# Patient Record
Sex: Male | Born: 1940 | Race: White | Hispanic: No | Marital: Married | Smoking: Former smoker
Health system: Southern US, Community
[De-identification: ages and names within clinical notes are randomized; demographics above are authoritative.]

## PROBLEM LIST (undated history)

## (undated) DIAGNOSIS — Z86010 Personal history of colonic polyps: Secondary | ICD-10-CM

## (undated) DIAGNOSIS — R5381 Other malaise: Secondary | ICD-10-CM

## (undated) DIAGNOSIS — J309 Allergic rhinitis, unspecified: Secondary | ICD-10-CM

## (undated) DIAGNOSIS — I2510 Atherosclerotic heart disease of native coronary artery without angina pectoris: Secondary | ICD-10-CM

## (undated) DIAGNOSIS — H6990 Unspecified Eustachian tube disorder, unspecified ear: Secondary | ICD-10-CM

## (undated) DIAGNOSIS — M25569 Pain in unspecified knee: Secondary | ICD-10-CM

## (undated) DIAGNOSIS — F17200 Nicotine dependence, unspecified, uncomplicated: Secondary | ICD-10-CM

## (undated) DIAGNOSIS — I219 Acute myocardial infarction, unspecified: Secondary | ICD-10-CM

## (undated) DIAGNOSIS — Z5189 Encounter for other specified aftercare: Secondary | ICD-10-CM

## (undated) DIAGNOSIS — R5383 Other fatigue: Secondary | ICD-10-CM

## (undated) DIAGNOSIS — E785 Hyperlipidemia, unspecified: Secondary | ICD-10-CM

## (undated) DIAGNOSIS — N529 Male erectile dysfunction, unspecified: Secondary | ICD-10-CM

## (undated) DIAGNOSIS — M479 Spondylosis, unspecified: Secondary | ICD-10-CM

## (undated) DIAGNOSIS — M1990 Unspecified osteoarthritis, unspecified site: Secondary | ICD-10-CM

## (undated) DIAGNOSIS — N400 Enlarged prostate without lower urinary tract symptoms: Secondary | ICD-10-CM

## (undated) DIAGNOSIS — T7840XA Allergy, unspecified, initial encounter: Secondary | ICD-10-CM

## (undated) DIAGNOSIS — I252 Old myocardial infarction: Secondary | ICD-10-CM

## (undated) DIAGNOSIS — F329 Major depressive disorder, single episode, unspecified: Secondary | ICD-10-CM

## (undated) DIAGNOSIS — Z87898 Personal history of other specified conditions: Secondary | ICD-10-CM

## (undated) DIAGNOSIS — K219 Gastro-esophageal reflux disease without esophagitis: Secondary | ICD-10-CM

## (undated) DIAGNOSIS — G4700 Insomnia, unspecified: Secondary | ICD-10-CM

## (undated) HISTORY — DX: Allergic rhinitis, unspecified: J30.9

## (undated) HISTORY — DX: Personal history of other specified conditions: Z87.898

## (undated) HISTORY — DX: Benign prostatic hyperplasia without lower urinary tract symptoms: N40.0

## (undated) HISTORY — DX: Encounter for other specified aftercare: Z51.89

## (undated) HISTORY — DX: Spondylosis, unspecified: M47.9

## (undated) HISTORY — DX: Pain in unspecified knee: M25.569

## (undated) HISTORY — DX: Male erectile dysfunction, unspecified: N52.9

## (undated) HISTORY — DX: Personal history of colonic polyps: Z86.010

## (undated) HISTORY — DX: Other fatigue: R53.83

## (undated) HISTORY — DX: Other malaise: R53.81

## (undated) HISTORY — DX: Unspecified eustachian tube disorder, unspecified ear: H69.90

## (undated) HISTORY — DX: Allergy, unspecified, initial encounter: T78.40XA

## (undated) HISTORY — DX: Unspecified osteoarthritis, unspecified site: M19.90

## (undated) HISTORY — PX: COLONOSCOPY: SHX174

## (undated) HISTORY — DX: Atherosclerotic heart disease of native coronary artery without angina pectoris: I25.10

## (undated) HISTORY — DX: Acute myocardial infarction, unspecified: I21.9

## (undated) HISTORY — DX: Nicotine dependence, unspecified, uncomplicated: F17.200

## (undated) HISTORY — DX: Hyperlipidemia, unspecified: E78.5

## (undated) HISTORY — DX: Gastro-esophageal reflux disease without esophagitis: K21.9

## (undated) HISTORY — PX: TONSILLECTOMY AND ADENOIDECTOMY: SUR1326

## (undated) HISTORY — DX: Old myocardial infarction: I25.2

## (undated) HISTORY — DX: Insomnia, unspecified: G47.00

## (undated) HISTORY — PX: CARDIAC CATHETERIZATION: SHX172

## (undated) HISTORY — DX: Major depressive disorder, single episode, unspecified: F32.9

## (undated) HISTORY — PX: POLYPECTOMY: SHX149

---
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
## 1976-07-23 HISTORY — PX: VASECTOMY: SHX75

## 1989-07-23 HISTORY — PX: CORONARY ARTERY BYPASS GRAFT: SHX141

## 2005-04-02 ENCOUNTER — Ambulatory Visit: Payer: Self-pay | Admitting: Internal Medicine

## 2006-01-24 ENCOUNTER — Ambulatory Visit: Payer: Self-pay | Admitting: Internal Medicine

## 2006-01-24 ENCOUNTER — Ambulatory Visit (HOSPITAL_COMMUNITY): Admission: RE | Admit: 2006-01-24 | Discharge: 2006-01-24 | Payer: Self-pay | Admitting: Internal Medicine

## 2006-06-17 ENCOUNTER — Ambulatory Visit: Payer: Self-pay | Admitting: Internal Medicine

## 2006-06-17 LAB — CONVERTED CEMR LAB
ALT: 51 units/L — ABNORMAL HIGH (ref 0–40)
Albumin: 4.3 g/dL (ref 3.5–5.2)
Alkaline Phosphatase: 46 units/L (ref 39–117)
Basophils Absolute: 0 10*3/uL (ref 0.0–0.1)
CO2: 23 meq/L (ref 19–32)
Chol/HDL Ratio, serum: 4.5
GFR calc non Af Amer: 80 mL/min
Glucose, Bld: 115 mg/dL — ABNORMAL HIGH (ref 70–99)
MCV: 93.1 fL (ref 78.0–100.0)
Monocytes Absolute: 0.8 10*3/uL — ABNORMAL HIGH (ref 0.2–0.7)
Neutro Abs: 9.1 10*3/uL — ABNORMAL HIGH (ref 1.4–7.7)
Neutrophils Relative %: 75.7 % (ref 43.0–77.0)
PSA: 1.45 ng/mL
Platelets: 271 10*3/uL (ref 150–400)
RBC: 5.19 M/uL (ref 4.22–5.81)
RDW: 12.7 % (ref 11.5–14.6)
Specific Gravity, Urine: 1.02 (ref 1.000–1.03)
TSH: 1.7 microintl units/mL (ref 0.35–5.50)
Total Bilirubin: 1.2 mg/dL (ref 0.3–1.2)
Total Protein, Urine: 30 mg/dL — AB
Urine Glucose: NEGATIVE mg/dL
Urobilinogen, UA: 0.2 (ref 0.0–1.0)
VLDL: 14 mg/dL (ref 0–40)

## 2007-02-14 ENCOUNTER — Ambulatory Visit: Payer: Self-pay | Admitting: Internal Medicine

## 2007-02-15 ENCOUNTER — Encounter: Payer: Self-pay | Admitting: Internal Medicine

## 2007-02-15 DIAGNOSIS — Z87898 Personal history of other specified conditions: Secondary | ICD-10-CM | POA: Insufficient documentation

## 2007-02-15 DIAGNOSIS — J309 Allergic rhinitis, unspecified: Secondary | ICD-10-CM

## 2007-02-15 DIAGNOSIS — E785 Hyperlipidemia, unspecified: Secondary | ICD-10-CM

## 2007-02-15 DIAGNOSIS — I2510 Atherosclerotic heart disease of native coronary artery without angina pectoris: Secondary | ICD-10-CM | POA: Insufficient documentation

## 2007-02-15 DIAGNOSIS — I252 Old myocardial infarction: Secondary | ICD-10-CM

## 2007-02-15 DIAGNOSIS — N400 Enlarged prostate without lower urinary tract symptoms: Secondary | ICD-10-CM

## 2007-02-15 DIAGNOSIS — M479 Spondylosis, unspecified: Secondary | ICD-10-CM | POA: Insufficient documentation

## 2007-02-15 HISTORY — DX: Atherosclerotic heart disease of native coronary artery without angina pectoris: I25.10

## 2007-02-15 HISTORY — DX: Hyperlipidemia, unspecified: E78.5

## 2007-02-15 HISTORY — DX: Old myocardial infarction: I25.2

## 2007-02-15 HISTORY — DX: Allergic rhinitis, unspecified: J30.9

## 2007-02-15 HISTORY — DX: Personal history of other specified conditions: Z87.898

## 2007-02-15 HISTORY — DX: Benign prostatic hyperplasia without lower urinary tract symptoms: N40.0

## 2007-02-15 HISTORY — DX: Spondylosis, unspecified: M47.9

## 2007-03-10 ENCOUNTER — Ambulatory Visit: Payer: Self-pay | Admitting: Internal Medicine

## 2007-08-29 ENCOUNTER — Ambulatory Visit: Payer: Self-pay | Admitting: Internal Medicine

## 2007-08-29 ENCOUNTER — Encounter (INDEPENDENT_AMBULATORY_CARE_PROVIDER_SITE_OTHER): Payer: Self-pay | Admitting: *Deleted

## 2007-08-29 DIAGNOSIS — F329 Major depressive disorder, single episode, unspecified: Secondary | ICD-10-CM

## 2007-08-29 DIAGNOSIS — F3289 Other specified depressive episodes: Secondary | ICD-10-CM

## 2007-08-29 DIAGNOSIS — R5381 Other malaise: Secondary | ICD-10-CM

## 2007-08-29 DIAGNOSIS — Z860100 Personal history of colon polyps, unspecified: Secondary | ICD-10-CM | POA: Insufficient documentation

## 2007-08-29 DIAGNOSIS — F32A Depression, unspecified: Secondary | ICD-10-CM | POA: Insufficient documentation

## 2007-08-29 DIAGNOSIS — M25569 Pain in unspecified knee: Secondary | ICD-10-CM

## 2007-08-29 DIAGNOSIS — R5383 Other fatigue: Secondary | ICD-10-CM

## 2007-08-29 HISTORY — DX: Other specified depressive episodes: F32.89

## 2007-08-29 HISTORY — DX: Other malaise: R53.81

## 2007-08-29 HISTORY — DX: Personal history of colon polyps, unspecified: Z86.0100

## 2007-08-29 HISTORY — DX: Personal history of colonic polyps: Z86.010

## 2007-08-29 HISTORY — DX: Pain in unspecified knee: M25.569

## 2007-08-29 HISTORY — DX: Major depressive disorder, single episode, unspecified: F32.9

## 2007-09-01 LAB — CONVERTED CEMR LAB
Basophils Absolute: 0.6 10*3/uL — ABNORMAL HIGH (ref 0.0–0.1)
CO2: 29 meq/L (ref 19–32)
Cholesterol: 225 mg/dL (ref 0–200)
Creatinine, Ser: 0.9 mg/dL (ref 0.4–1.5)
Direct LDL: 167.6 mg/dL
Eosinophils Relative: 2.2 % (ref 0.0–5.0)
GFR calc Af Amer: 109 mL/min
GFR calc non Af Amer: 90 mL/min
HCT: 43.3 % (ref 39.0–52.0)
Monocytes Absolute: 0.9 10*3/uL — ABNORMAL HIGH (ref 0.2–0.7)
Monocytes Relative: 7.6 % (ref 3.0–11.0)
Neutro Abs: 7.5 10*3/uL (ref 1.4–7.7)
Potassium: 4.6 meq/L (ref 3.5–5.1)
RBC: 4.67 M/uL (ref 4.22–5.81)
Total Bilirubin: 0.7 mg/dL (ref 0.3–1.2)
Triglycerides: 130 mg/dL (ref 0–149)
VLDL: 26 mg/dL (ref 0–40)

## 2007-10-01 ENCOUNTER — Ambulatory Visit: Payer: Self-pay | Admitting: Gastroenterology

## 2007-12-03 ENCOUNTER — Encounter: Payer: Self-pay | Admitting: Gastroenterology

## 2007-12-03 ENCOUNTER — Ambulatory Visit: Payer: Self-pay | Admitting: Gastroenterology

## 2007-12-05 ENCOUNTER — Encounter: Payer: Self-pay | Admitting: Gastroenterology

## 2009-03-08 ENCOUNTER — Ambulatory Visit: Payer: Self-pay | Admitting: Internal Medicine

## 2009-03-11 ENCOUNTER — Telehealth: Payer: Self-pay | Admitting: Internal Medicine

## 2010-03-09 ENCOUNTER — Encounter: Payer: Self-pay | Admitting: Internal Medicine

## 2010-03-09 ENCOUNTER — Ambulatory Visit: Payer: Self-pay | Admitting: Internal Medicine

## 2010-03-09 DIAGNOSIS — F17200 Nicotine dependence, unspecified, uncomplicated: Secondary | ICD-10-CM

## 2010-03-09 DIAGNOSIS — H6990 Unspecified Eustachian tube disorder, unspecified ear: Secondary | ICD-10-CM | POA: Insufficient documentation

## 2010-03-09 DIAGNOSIS — G4700 Insomnia, unspecified: Secondary | ICD-10-CM

## 2010-03-09 HISTORY — DX: Nicotine dependence, unspecified, uncomplicated: F17.200

## 2010-03-09 HISTORY — DX: Insomnia, unspecified: G47.00

## 2010-03-09 HISTORY — DX: Unspecified eustachian tube disorder, unspecified ear: H69.90

## 2010-03-09 LAB — CONVERTED CEMR LAB
AST: 27 units/L (ref 0–37)
Albumin: 4.3 g/dL (ref 3.5–5.2)
BUN: 18 mg/dL (ref 6–23)
Basophils Absolute: 0.1 10*3/uL (ref 0.0–0.1)
Basophils Relative: 0.8 % (ref 0.0–3.0)
Bilirubin Urine: NEGATIVE
Creatinine, Ser: 0.9 mg/dL (ref 0.4–1.5)
Folate: >20 ng/mL
GFR calc non Af Amer: 86.93 mL/min (ref >60)
Glucose, Bld: 91 mg/dL (ref 70–99)
HCT: 43.7 % (ref 39.0–52.0)
HDL: 34.7 mg/dL — ABNORMAL LOW (ref >39.00)
Hemoglobin: 15 g/dL (ref 13.0–17.0)
Iron: 111 ug/dL (ref 42–165)
Ketones, ur: NEGATIVE mg/dL
Lymphocytes Relative: 25.4 % (ref 12.0–46.0)
Lymphs Abs: 2.4 10*3/uL (ref 0.7–4.0)
MCHC: 34.4 g/dL (ref 30.0–36.0)
MCV: 94.3 fL (ref 78.0–100.0)
Monocytes Absolute: 0.7 10*3/uL (ref 0.1–1.0)
Nitrite: NEGATIVE
Potassium: 5 meq/L (ref 3.5–5.1)
RBC: 4.63 M/uL (ref 4.22–5.81)
Sed Rate: 23 mm/hr — ABNORMAL HIGH (ref 0–22)
Sodium: 138 meq/L (ref 135–145)
Total CHOL/HDL Ratio: 4
Urine Glucose: NEGATIVE mg/dL
pH: 5.5 (ref 5.0–8.0)

## 2010-03-10 ENCOUNTER — Encounter: Payer: Self-pay | Admitting: Internal Medicine

## 2010-03-13 ENCOUNTER — Encounter: Payer: Self-pay | Admitting: Internal Medicine

## 2010-03-13 ENCOUNTER — Ambulatory Visit: Payer: Self-pay

## 2010-04-13 ENCOUNTER — Telehealth: Payer: Self-pay | Admitting: Internal Medicine

## 2010-08-20 LAB — CONVERTED CEMR LAB
ALT: 30 units/L (ref 0–53)
BUN: 18 mg/dL (ref 6–23)
Basophils Absolute: 0.1 10*3/uL (ref 0.0–0.1)
Bilirubin Urine: NEGATIVE
Bilirubin, Direct: 0.1 mg/dL (ref 0.0–0.3)
CO2: 28 meq/L (ref 19–32)
Calcium: 8.8 mg/dL (ref 8.4–10.5)
Cholesterol: 230 mg/dL — ABNORMAL HIGH (ref 0–200)
Direct LDL: 168.6 mg/dL
Eosinophils Absolute: 0.4 10*3/uL (ref 0.0–0.7)
GFR calc non Af Amer: 79.19 mL/min (ref >60)
Glucose, Bld: 117 mg/dL — ABNORMAL HIGH (ref 70–99)
HDL: 27.9 mg/dL — ABNORMAL LOW (ref >39.00)
Hemoglobin: 14.5 g/dL (ref 13.0–17.0)
Hgb A1c MFr Bld: 6 % (ref 4.6–6.5)
Ketones, ur: NEGATIVE mg/dL
MCV: 94.9 fL (ref 78.0–100.0)
Neutro Abs: 4.3 10*3/uL (ref 1.4–7.7)
PSA: 1.51 ng/mL (ref 0.10–4.00)
Platelets: 245 10*3/uL (ref 150.0–400.0)
RDW: 12.4 % (ref 11.5–14.6)
Sodium: 141 meq/L (ref 135–145)
Specific Gravity, Urine: 1.015 (ref 1.000–1.030)
Total Protein, Urine: NEGATIVE mg/dL
Triglycerides: 286 mg/dL — ABNORMAL HIGH (ref 0.0–149.0)
Urine Glucose: NEGATIVE mg/dL
VLDL: 57.2 mg/dL — ABNORMAL HIGH (ref 0.0–40.0)
pH: 6 (ref 5.0–8.0)

## 2010-08-22 NOTE — Miscellaneous (Signed)
Summary: Orders Update  Clinical Lists Changes  Orders: Added new Test order of Arterial Duplex Lower Extremity (Arterial Duplex Low) - Signed 

## 2010-08-22 NOTE — Progress Notes (Signed)
    Immunization History:  Influenza Immunization History:    Influenza:  historical (04/12/2010)  [facility], [address] [place], [place] [address] Vaccine, Fluvirin Dose, 0.13ml Injection site, Left Deltoid IM Mfg, [employer]is Date Administered, 04/12/2010 Lot # [id number]

## 2010-08-22 NOTE — Assessment & Plan Note (Signed)
Summary: YEARLY FU/ MEDICARE/ TO COME FASTING/NWS   Vital Signs:  Patient profile:   70 year old male Height:      66.5 inches Weight:      175.75 pounds BMI:     28.04 O2 Sat:      97 % on Room air Temp:     98.6 degrees F oral Pulse rate:   47 / minute BP sitting:   120 / 80  (left arm) Cuff size:   regular  Vitals Entered By: Zella Ball Ewing CMA Duncan Dull) (March 09, 2010 10:58 AM)  O2 Flow:  Room air  CC: yearly followup/RE/wellness    CC:  yearly followup/RE/wellness .  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  Also with signficant issue with sleep at night - wife wears CPAP and he is sleeping more lighlty as he gets older. Pt denies CP, worsening sob, doe, wheezing, orthopnea, pnd, worsening LE edema, palps, dizziness or syncope No fever, wt loss, night sweats, loss of appetite or other constitutional symptoms  Also with several weeks nasal allergy symptoms without facial pain, pressure, fever or d/c.  Has some bilat ear popping, without pain or decreased hearing  Here for wellness Diet: Heart Healthy or DM if diabetic Physical Activities: Sedentary Depression/mood screen: Negative Hearing: Intact bilateral Visual Acuity: Grossly normal, does not get exams, werars reading glasses ADL's: Capable  Fall Risk: None Home Safety: Good Cognitive Impairment:  Gen appearance, affect, speech, memory, attention & motor skills grossly intact End-of-Life Planning: Advance directive - Full code/I agree ,  but has living will , does not want prolonged mech  ventilation  Preventive Screening-Counseling & Management  Alcohol-Tobacco     Smoking Status: quit      Drug Use:  no.    Problems Prior to Update: 1)  Special Screening Malig Neoplasms Other Sites  (ICD-V76.49) 2)  Fatigue  (ICD-780.79) 3)  Unspecified Eustachian Tube Disorder  (ICD-381.9) 4)  Fatigue  (ICD-780.79) 5)  Insomnia-sleep Disorder-unspec  (ICD-780.52) 6)  Tobacco Abuse  (ICD-305.1) 7)  Preventive Health Care  (ICD-V70.0) 8)  Fatigue  (ICD-780.79) 9)  Special Screening Malignant Neoplasm of Prostate  (ICD-V76.44) 10)  Fatigue  (ICD-780.79) 11)  Knee Pain, Left  (ICD-719.46) 12)  Depression  (ICD-311) 13)  Colonic Polyps, Hx of  (ICD-V12.72) 14)  Shingles, Hx of  (ICD-V13.8) 15)  Degenerative Joint Disease, 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 1 By Mouth Qd 2)  Nasacort Aq 55 Mcg/act Aers (Triamcinolone Acetonide(Nasal)) .... Inhale 2 Spray As Directed Once A Day 3)  Ecotrin Low Strength 81 Mg  Tbec (Aspirin) .Marland Kitchen.. 1 By Mouth Qd 4)  Atenolol 25 Mg  Tabs (Atenolol) .Marland Kitchen.. 1 By Mouth Qd 5)  Levitra 20 Mg  Tabs (Vardenafil Hcl) .Marland KitchenMarland KitchenMarland Kitchen  1 By Mouth Once Daily Prn 6)  Cetirizine Hcl 10 Mg Tabs (Cetirizine Hcl) .Marland Kitchen.. 1po Once Daily  Current Medications (verified): 1)  Crestor 20 Mg  Tabs (Rosuvastatin Calcium) .Marland Kitchen.. 1 By Mouth Once Daily 2)  Nasacort Aq 55 Mcg/act Aers (Triamcinolone Acetonide(Nasal)) .... Inhale 2 Spray As Directed Once A Day 3)  Ecotrin Low Strength 81 Mg  Tbec (Aspirin) .Marland Kitchen.. 1 By Mouth Qd 4)  Atenolol 25 Mg  Tabs (Atenolol) .Marland Kitchen.. 1 By Mouth Once Daily 5)  Levitra 20 Mg  Tabs (Vardenafil Hcl) .Marland Kitchen.. 1 By Mouth Once Daily Prn 6)  Fexofenadine Hcl 180 Mg Tabs (Fexofenadine Hcl) .Marland Kitchen.. 1po  Once Daily As Needed 7)  Zolpidem Tartrate 5 Mg Tabs (Zolpidem Tartrate) .Marland Kitchen.. 1po At Bedtime As Needed  Allergies (verified): 1)  ! Pcn 2)  ! * Mycins 3)  ! * Cialis 4)  Halcion  Past History:  Past Surgical History: Last updated: 02/15/2007 Appendectomy- 1951 Coronary artery bypass graft- 1991  Family History: Last updated: 2007-09-11 father died with heart disease at 55 yo mother died with RA  Social History: Last updated: 03/09/2010 Alcohol use-yes Married 4 children retired Former Smoker - quit 2010 Drug use-no  Risk Factors: Smoking Status: quit (03/09/2010) Packs/Day: 1 PPD (02/15/2007)  Past Medical History: Allergic rhinitis Coronary artery disease Hyperlipidemia Myocardial infarction, hx of Benign prostatic hypertrophy H/O C-Spine DJD H/O Shingles Colonic polyps, hx of - adenomatous Depression E.D.  MD roster: none  Social History: Reviewed history from 09/11/07 and no changes required. Alcohol use-yes Married 4 children retired Former Smoker - quit 2010 Drug use-no Smoking Status:  quit Drug Use:  no  Review of Systems  The patient denies anorexia, fever, vision loss, decreased hearing, hoarseness, chest pain, syncope, dyspnea on exertion, peripheral edema, prolonged cough, headaches, hemoptysis, abdominal pain, melena, hematochezia, severe indigestion/heartburn, hematuria, muscle weakness, suspicious skin lesions, transient blindness, difficulty walking, depression, unusual weight change, abnormal bleeding, enlarged lymph nodes, and angioedema.         all otherwise negative per pt -  except for mild ongoing fatigue without OSA symtpoms or worsening depressive symptoms, and ongoing insomnia with problem getting to sleep  Physical Exam  General:  alert and well-developed.   Head:  normocephalic and atraumatic.   Eyes:  vision grossly intact, pupils equal, and pupils round.   Ears:  R ear normal and L ear normal.   Nose:  no external  deformity and no nasal discharge.   Mouth:  no gingival abnormalities and pharynx pink and moist.   Neck:  supple and no masses.   Lungs:  normal respiratory effort and normal breath sounds.   Heart:  normal rate and regular rhythm.   Abdomen:  soft, non-tender, and normal bowel sounds.   Msk:  no joint tenderness and no joint swelling.   Extremities:  no edema, no erythema  Neurologic:  cranial nerves II-XII intact and strength normal in all extremities.   Skin:  color normal and no rashes.   Psych:  not anxious appearing and not depressed appearing.     Impression & Recommendations:  Problem # 1:  Preventive Health Care (ICD-V70.0)  Overall doing well, age appropriate education and counseling updated and referral for appropriate preventive services done unless declined, immunizations up to date or declined, diet counseling done if overweight, urged to quit smoking if smokes , most recent labs reviewed and current ordered if appropriate, ecg reviewed or declined (interpretation per ECG scanned in the EMR if done);  information regarding Medicare Prevention requirements given if appropriate; speciality referrals updated as appropriate ; also for aortic u/s; but declines shingles shot due to cost  Orders: EKG w/ Interpretation (93000) Radiology Referral (Radiology) Medicare -1st Annual Wellness Visit [phone])  Problem # 2:  INSOMNIA-SLEEP DISORDER-UNSPEC (ICD-780.52)  His updated medication list for this problem includes:    Zolpidem Tartrate 5 Mg Tabs (Zolpidem tartrate) .Marland Kitchen... 1po at bedtime as needed treat as above, f/u any worsening signs or symptoms   Orders: Prescription Created Electronically [phone])  Problem # 3:  ALLERGIC RHINITIS (ICD-477.9)  ok for mucinex as needed  and treat as below, f/u any worsening signs or symptoms   The following medications were removed from the medication list:    Cetirizine Hcl 10 Mg Tabs (Cetirizine hcl) .Marland Kitchen... 1po once daily His updated  medication list for this problem includes:    Nasacort Aq 55 Mcg/act Aers (Triamcinolone acetonide(nasal)) ..... Inhale 2 spray as directed once a day    Fexofenadine Hcl 180 Mg Tabs (Fexofenadine hcl) .Marland Kitchen... 1po once daily as needed  Problem # 4:  FATIGUE (ICD-780.79) Assessment: New exam benign, to check labs below; follow with expectant management  Orders: TLB-BMP (Basic Metabolic Panel-BMET) (80048-METABOL) TLB-CBC Platelet - w/Differential (85025-CBCD) TLB-Hepatic/Liver Function Pnl (80076-HEPATIC) TLB-TSH (Thyroid Stimulating Hormone) (84443-TSH) TLB-Sedimentation Rate (ESR) (85652-ESR) TLB-IBC Pnl (Iron/FE;Transferrin) (83550-IBC) TLB-B12 + Folate Pnl (28413_24401-U27/OZD) TLB-Udip ONLY (81003-UDIP)  Problem # 5:  HYPERLIPIDEMIA (ICD-272.4)  His updated medication list for this problem includes:    Crestor 20 Mg Tabs (Rosuvastatin calcium) .Marland Kitchen... 1 by mouth once daily  Orders: TLB-Lipid Panel (80061-LIPID)  Labs Reviewed: SGOT: 30 (03/08/2009)   SGPT: 30 (03/08/2009)   HDL:27.90 (03/08/2009), 27.2 (08/29/2007)  LDL:DEL (08/29/2007), 91 (66/44/0347)  Chol:230 (03/08/2009), 225 (08/29/2007)  Trig:286.0 (03/08/2009), 130 (08/29/2007) stable overall by hx and exam, ok to continue meds/tx as is, Pt to continue diet efforts, good med tolerance; to check labs - goal LDL less than 70   Complete Medication List: 1)  Crestor 20 Mg Tabs (Rosuvastatin calcium) .Marland Kitchen.. 1 by mouth once daily 2)  Nasacort Aq 55 Mcg/act Aers (Triamcinolone acetonide(nasal)) .... Inhale 2 spray as directed once a day 3)  Ecotrin Low Strength 81 Mg Tbec (Aspirin) .Marland Kitchen.. 1 by mouth qd 4)  Atenolol 25 Mg Tabs (Atenolol) .Marland Kitchen.. 1 by mouth once daily 5)  Levitra 20 Mg Tabs (Vardenafil hcl) .Marland Kitchen.. 1 by mouth once daily prn 6)  Fexofenadine Hcl 180 Mg Tabs (Fexofenadine hcl) .Marland Kitchen.. 1po once daily as needed 7)  Zolpidem Tartrate 5 Mg Tabs (Zolpidem tartrate) .Marland Kitchen.. 1po at bedtime as needed  Other Orders: TLB-PSA (Prostate  Specific Antigen) (84153-PSA)  Patient Instructions: 1)  You will be contacted about the referral(s) to: aortic u/s 2)  Please go to the Lab in the basement for your blood and/or urine tests today  3)  Please see an opthomology once per yr - consdier [facility] or Dr Nile Riggs 4)  Please take all new medications as prescribed  - the generic allegra, and the generic ambien 5)  please call [employer] to see if they will pay for the shingles shot 6)  Please schedule a follow-up appointment in 1 year or sooner if needed Prescriptions: CRESTOR 20 MG  TABS (ROSUVASTATIN CALCIUM) 1 by mouth once daily  #90 x 3   Entered and Authorized by:   Corwin Levins MD   Signed by:   Corwin Levins MD on 03/09/2010   Method used:   Print then Give  to Patient   RxID:   [id number] NASACORT AQ 55 MCG/ACT AERS (TRIAMCINOLONE ACETONIDE(NASAL)) Inhale 2 spray as directed once a day  #3 x 3   Entered and Authorized by:   Corwin Levins MD   Signed by:   Corwin Levins MD on 03/09/2010   Method used:   Print then Give to Patient   RxID:   [id number] ATENOLOL 25 MG  TABS (ATENOLOL) 1 by mouth once daily  #90 x 3   Entered and Authorized by:   Corwin Levins MD   Signed by:   Corwin Levins MD on 03/09/2010   Method used:   Print then Give to Patient   RxID:   [id number] LEVITRA 20 MG  TABS (VARDENAFIL HCL) 1 by mouth once daily prn  #15 x 3   Entered and Authorized by:   Corwin Levins MD   Signed by:   Corwin Levins MD on 03/09/2010   Method used:   Print then Give to Patient   RxID:   [id number] ZOLPIDEM TARTRATE 5 MG TABS (ZOLPIDEM TARTRATE) 1po at bedtime as needed  #90 x 1   Entered and Authorized by:   Corwin Levins MD   Signed by:   Corwin Levins MD on 03/09/2010   Method used:   Print then Give to Patient   RxID:   [id number] FEXOFENADINE HCL 180 MG TABS (FEXOFENADINE HCL) 1po once daily as needed  #90 x 3   Entered and Authorized by:   Corwin Levins MD   Signed by:   Corwin Levins  MD on 03/09/2010   Method used:   Print then Give to Patient   RxID:   [id number]

## 2011-03-15 ENCOUNTER — Other Ambulatory Visit: Payer: Self-pay | Admitting: Internal Medicine

## 2011-03-27 ENCOUNTER — Encounter: Payer: Self-pay | Admitting: Internal Medicine

## 2011-04-06 ENCOUNTER — Encounter: Payer: Self-pay | Admitting: Internal Medicine

## 2011-04-06 DIAGNOSIS — Z0001 Encounter for general adult medical examination with abnormal findings: Secondary | ICD-10-CM | POA: Insufficient documentation

## 2011-04-06 DIAGNOSIS — Z0000 Encounter for general adult medical examination without abnormal findings: Secondary | ICD-10-CM | POA: Insufficient documentation

## 2011-04-09 ENCOUNTER — Other Ambulatory Visit: Payer: Self-pay | Admitting: Internal Medicine

## 2011-04-09 ENCOUNTER — Ambulatory Visit (INDEPENDENT_AMBULATORY_CARE_PROVIDER_SITE_OTHER): Payer: Medicare Other | Admitting: Internal Medicine

## 2011-04-09 ENCOUNTER — Other Ambulatory Visit (INDEPENDENT_AMBULATORY_CARE_PROVIDER_SITE_OTHER): Payer: Medicare Other

## 2011-04-09 ENCOUNTER — Encounter: Payer: Self-pay | Admitting: Internal Medicine

## 2011-04-09 VITALS — BP 128/72 | HR 60 | Temp 98.2°F | Wt 175.0 lb

## 2011-04-09 DIAGNOSIS — R5383 Other fatigue: Secondary | ICD-10-CM

## 2011-04-09 DIAGNOSIS — N320 Bladder-neck obstruction: Secondary | ICD-10-CM

## 2011-04-09 DIAGNOSIS — R5381 Other malaise: Secondary | ICD-10-CM

## 2011-04-09 DIAGNOSIS — J309 Allergic rhinitis, unspecified: Secondary | ICD-10-CM

## 2011-04-09 DIAGNOSIS — E785 Hyperlipidemia, unspecified: Secondary | ICD-10-CM

## 2011-04-09 DIAGNOSIS — F329 Major depressive disorder, single episode, unspecified: Secondary | ICD-10-CM

## 2011-04-09 DIAGNOSIS — F3289 Other specified depressive episodes: Secondary | ICD-10-CM

## 2011-04-09 LAB — CBC WITH DIFFERENTIAL/PLATELET
Basophils Absolute: 0 10*3/uL (ref 0.0–0.1)
Eosinophils Absolute: 0.5 10*3/uL (ref 0.0–0.7)
Eosinophils Relative: 5.1 % — ABNORMAL HIGH (ref 0.0–5.0)
Hemoglobin: 14.7 g/dL (ref 13.0–17.0)
Lymphocytes Relative: 26.9 % (ref 12.0–46.0)
Lymphs Abs: 2.4 10*3/uL (ref 0.7–4.0)
MCHC: 34 g/dL (ref 30.0–36.0)
MCV: 93.5 fl (ref 78.0–100.0)
Monocytes Relative: 7.7 % (ref 3.0–12.0)
Platelets: 249 10*3/uL (ref 150.0–400.0)
RBC: 4.63 Mil/uL (ref 4.22–5.81)
RDW: 13.3 % (ref 11.5–14.6)
WBC: 9.1 10*3/uL (ref 4.5–10.5)

## 2011-04-09 LAB — URINALYSIS, ROUTINE W REFLEX MICROSCOPIC
Hgb urine dipstick: NEGATIVE
Specific Gravity, Urine: 1.01 (ref 1.000–1.030)
Urine Glucose: NEGATIVE
pH: 5.5 (ref 5.0–8.0)

## 2011-04-09 LAB — HEPATIC FUNCTION PANEL
AST: 35 U/L (ref 0–37)
Alkaline Phosphatase: 47 U/L (ref 39–117)
Bilirubin, Direct: 0.1 mg/dL (ref 0.0–0.3)

## 2011-04-09 LAB — BASIC METABOLIC PANEL
Creatinine, Ser: 0.9 mg/dL (ref 0.4–1.5)
GFR: 94.94 mL/min (ref >60.00)

## 2011-04-09 LAB — TSH: TSH: 1.49 u[IU]/mL (ref 0.35–5.50)

## 2011-04-09 MED ORDER — TRIAMCINOLONE ACETONIDE(NASAL) 55 MCG/ACT NA INHA: 2.0000 | Freq: Every day | NASAL | Status: DC

## 2011-04-09 MED ORDER — ROSUVASTATIN CALCIUM 20 MG PO TABS: 20.0000 mg | ORAL_TABLET | Freq: Every day | ORAL | Status: DC

## 2011-04-09 NOTE — Progress Notes (Signed)
Subjective:    Patient ID: Brian Branch, male    DOB: Apr 17, 1941, 70 y.o.   MRN: [id number]  HPI  Here to f/u; overall doing ok,  Pt denies chest pain, increased sob or doe, wheezing, orthopnea, PND, increased LE swelling, palpitations, dizziness or syncope.  Pt denies new neurological symptoms such as new headache, or facial or extremity weakness or numbness   Pt denies polydipsia, polyuria.  Pt states overall good compliance with meds, trying to follow lower cholesterol diet, wt overall stable but little exercise however.  Does have sense of ongoing fatigue, but denies signficant hypersomnolence. Stopped his tenormin after he saw on google about fingernail changes.  Denies worsening depressive symptoms, suicidal ideation, or panic.  Does have several wks ongoing nasal allergy symptoms with clear congestion, itch and sneeze, without fever, pain, ST, cough or wheezing. Past Medical History  Diagnosis Date  . ALLERGIC RHINITIS 02/15/2007  . BENIGN PROSTATIC HYPERTROPHY 02/15/2007  . COLONIC POLYPS, HX OF 08/29/2007  . CORONARY ARTERY DISEASE 02/15/2007  . 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 DEPRESSION 08/29/2007  . FATIGUE 08/29/2007  . HYPERLIPIDEMIA 02/15/2007  . INSOMNIA-SLEEP DISORDER-UNSPEC 03/09/2010  . KNEE PAIN, LEFT 08/29/2007  . 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 SHINGLES, 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  . TOBACCO ABUSE 03/09/2010  . Unspecified eustachian tube disorder 03/09/2010  . CAD (coronary artery disease)   . ED (erectile dysfunction)    Past Surgical History  Procedure Date  . Appendectomy 1951  . Coronary artery bypass graft 1991    reports that he quit smoking about 2 years ago. He does not have any smokeless tobacco history on file. He reports that he drinks alcohol. He reports that he does not use illicit drugs. family history includes Arthritis in his mother and Heart disease in his father. Allergies  Allergen Reactions  . Levitra (Vardenafil Hydrochloride)       headache  . Penicillins     REACTION: Hives  . Tadalafil     REACTION: headache  . Triazolam     REACTION: hallucinations   No current outpatient prescriptions on file prior to visit.   Review of Systems Review of Systems  Constitutional: Negative for diaphoresis and unexpected weight change.  HENT: Negative for drooling and tinnitus.   Eyes: Negative for photophobia and visual disturbance.  Respiratory: Negative for choking and stridor.   Gastrointestinal: Negative for vomiting and blood in stool.  Genitourinary: Negative for hematuria and decreased urine volume.  Musculoskeletal: Negative for gait problem.  Skin: Negative for color change and wound.  Neurological: Negative for tremors and numbness.  Psychiatric/Behavioral: Negative for decreased concentration. The patient is not hyperactive.      Objective:   Physical Exam BP 128/72  Pulse 60  Temp(Src) 98.2 F (36.8 C) (Oral)  Wt 175 lb (79.379 kg)  SpO2 96% Physical Exam  VS noted Constitutional: Pt appears well-developed and well-nourished.  HENT: Head: Normocephalic.  Right Ear: External ear normal.  Left Ear: External ear normal.  Bilat tm's mild erythema.  Sinus nontender.  Pharynx mild erythema Eyes: Conjunctivae and EOM are normal. Pupils are equal, round, and reactive to light.  Neck: Normal range of motion. Neck supple.  Cardiovascular: Normal rate and regular rhythm.   Pulmonary/Chest: Effort normal and breath sounds normal.  Abd:  Soft, NT, non-distended, + BS Neurological: Pt is alert. No cranial nerve deficit.  Skin: Skin is warm. No erythema.  Psychiatric: Pt behavior is normal. Thought content normal. 1+  nervous, not depressed affect    Assessment & Plan:

## 2011-04-09 NOTE — Patient Instructions (Signed)
Continue all other medications as before Please go to LAB in the Basement for the blood and/or urine tests to be done today Please call the phone number [phone] (the [employer]Tree System) for results of testing in 2-3 days;  When calling, simply dial the number, and when prompted enter the MRN number above (the Medical Record Number) and the # key, then the message should start. Please return in 1 year for your yearly visit, or sooner if needed 

## 2011-04-10 LAB — LIPID PANEL
Cholesterol: 143 mg/dL (ref 0–200)
HDL: 33.9 mg/dL — ABNORMAL LOW (ref >39.00)

## 2011-04-15 NOTE — Assessment & Plan Note (Signed)
Etiology unclear, Exam otherwise benign, to check labs as documented, follow with expectant management  

## 2011-04-15 NOTE — Assessment & Plan Note (Signed)
Also for PSA as he is due, o/w stable

## 2011-04-15 NOTE — Assessment & Plan Note (Signed)
stable overall by hx and exam, most recent data reviewed with pt, and pt to continue medical treatment as before  Lab Results  Component Value Date   WBC 9.1 04/09/2011   HGB 14.7 04/09/2011   HCT 43.3 04/09/2011   PLT 249.0 04/09/2011   CHOL 143 04/09/2011   TRIG 212.0* 04/09/2011   HDL 33.90* 04/09/2011   LDLDIRECT 79.3 04/09/2011   ALT 32 04/09/2011   AST 35 04/09/2011   NA 137 04/09/2011   K 3.9 04/09/2011   CL 105 04/09/2011   CREATININE 0.9 04/09/2011   BUN 17 04/09/2011   CO2 24 04/09/2011   TSH 1.49 04/09/2011   PSA 1.25 04/09/2011   HGBA1C 6.0 03/08/2009

## 2011-04-15 NOTE — Assessment & Plan Note (Signed)
stable overall by hx and exam, most recent data reviewed with pt, and pt to continue medical treatment as before  Lab Results  Component Value Date   LDLCALC 71 03/09/2010

## 2011-04-15 NOTE — Assessment & Plan Note (Signed)
stable overall by hx and exam, most recent data reviewed with pt, and pt to continue medical treatment as before - encourage pt to take meds

## 2011-04-17 ENCOUNTER — Other Ambulatory Visit: Payer: Self-pay

## 2011-04-17 MED ORDER — ROSUVASTATIN CALCIUM 20 MG PO TABS: 20.0000 mg | ORAL_TABLET | Freq: Every day | ORAL | Status: DC

## 2011-04-17 MED ORDER — TRIAMCINOLONE ACETONIDE(NASAL) 55 MCG/ACT NA INHA: 2.0000 | Freq: Every day | NASAL | Status: DC

## 2011-04-18 ENCOUNTER — Telehealth: Payer: Self-pay

## 2011-04-18 NOTE — Telephone Encounter (Signed)
Flu Vaccine Dose 0.2ml Left arm IM Manufacturer [employer] Date Administered 04/13/2011 Lot # [id number] A Exp. Date 12/21/2011

## 2012-04-09 ENCOUNTER — Encounter: Payer: Self-pay | Admitting: Internal Medicine

## 2012-04-09 ENCOUNTER — Other Ambulatory Visit (INDEPENDENT_AMBULATORY_CARE_PROVIDER_SITE_OTHER): Payer: Medicare Other

## 2012-04-09 ENCOUNTER — Ambulatory Visit (INDEPENDENT_AMBULATORY_CARE_PROVIDER_SITE_OTHER): Payer: Medicare Other | Admitting: Internal Medicine

## 2012-04-09 VITALS — BP 128/70 | HR 66 | Temp 97.4°F | Ht 66.0 in | Wt 175.6 lb

## 2012-04-09 DIAGNOSIS — E789 Disorder of lipoprotein metabolism, unspecified: Secondary | ICD-10-CM

## 2012-04-09 DIAGNOSIS — M25511 Pain in right shoulder: Secondary | ICD-10-CM | POA: Insufficient documentation

## 2012-04-09 DIAGNOSIS — M79671 Pain in right foot: Secondary | ICD-10-CM | POA: Insufficient documentation

## 2012-04-09 DIAGNOSIS — N320 Bladder-neck obstruction: Secondary | ICD-10-CM | POA: Diagnosis not present

## 2012-04-09 DIAGNOSIS — E785 Hyperlipidemia, unspecified: Secondary | ICD-10-CM

## 2012-04-09 DIAGNOSIS — R5381 Other malaise: Secondary | ICD-10-CM

## 2012-04-09 DIAGNOSIS — R5383 Other fatigue: Secondary | ICD-10-CM

## 2012-04-09 DIAGNOSIS — Z23 Encounter for immunization: Secondary | ICD-10-CM | POA: Diagnosis not present

## 2012-04-09 DIAGNOSIS — I2510 Atherosclerotic heart disease of native coronary artery without angina pectoris: Secondary | ICD-10-CM

## 2012-04-09 DIAGNOSIS — Z136 Encounter for screening for cardiovascular disorders: Secondary | ICD-10-CM | POA: Diagnosis not present

## 2012-04-09 DIAGNOSIS — M25512 Pain in left shoulder: Secondary | ICD-10-CM | POA: Insufficient documentation

## 2012-04-09 DIAGNOSIS — R498 Other voice and resonance disorders: Secondary | ICD-10-CM

## 2012-04-09 DIAGNOSIS — M25519 Pain in unspecified shoulder: Secondary | ICD-10-CM

## 2012-04-09 DIAGNOSIS — M79609 Pain in unspecified limb: Secondary | ICD-10-CM

## 2012-04-09 DIAGNOSIS — R499 Unspecified voice and resonance disorder: Secondary | ICD-10-CM

## 2012-04-09 LAB — BASIC METABOLIC PANEL
BUN: 18 mg/dL (ref 6–23)
Calcium: 9.7 mg/dL (ref 8.4–10.5)
Chloride: 102 mEq/L (ref 96–112)
Creatinine, Ser: 1.1 mg/dL (ref 0.4–1.5)

## 2012-04-09 LAB — HEPATIC FUNCTION PANEL
ALT: 33 U/L (ref 0–53)
Total Bilirubin: 0.8 mg/dL (ref 0.3–1.2)

## 2012-04-09 LAB — CBC WITH DIFFERENTIAL/PLATELET
Basophils Relative: 0.4 % (ref 0.0–3.0)
Eosinophils Relative: 4 % (ref 0.0–5.0)
Hemoglobin: 14.9 g/dL (ref 13.0–17.0)
Lymphocytes Relative: 25 % (ref 12.0–46.0)
MCV: 93.9 fl (ref 78.0–100.0)
Monocytes Absolute: 0.7 10*3/uL (ref 0.1–1.0)
Neutrophils Relative %: 63 % (ref 43.0–77.0)
RBC: 4.79 Mil/uL (ref 4.22–5.81)
WBC: 9.6 10*3/uL (ref 4.5–10.5)

## 2012-04-09 LAB — URINALYSIS, ROUTINE W REFLEX MICROSCOPIC
Ketones, ur: NEGATIVE
Specific Gravity, Urine: 1.01 (ref 1.000–1.030)
Urine Glucose: NEGATIVE
Urobilinogen, UA: 0.2 (ref 0.0–1.0)

## 2012-04-09 LAB — SEDIMENTATION RATE: Sed Rate: 17 mm/hr (ref 0–22)

## 2012-04-09 LAB — LIPID PANEL
Cholesterol: 134 mg/dL (ref 0–200)
HDL: 31.3 mg/dL — ABNORMAL LOW (ref >39.00)
Triglycerides: 213 mg/dL — ABNORMAL HIGH (ref 0.0–149.0)
VLDL: 42.6 mg/dL — ABNORMAL HIGH (ref 0.0–40.0)

## 2012-04-09 MED ORDER — ASPIRIN 81 MG PO TBEC: 81.0000 mg | DELAYED_RELEASE_TABLET | Freq: Every day | ORAL | Status: DC

## 2012-04-09 MED ORDER — ROSUVASTATIN CALCIUM 20 MG PO TABS: 20.0000 mg | ORAL_TABLET | Freq: Every day | ORAL | Status: DC

## 2012-04-09 MED ORDER — TRIAMCINOLONE ACETONIDE(NASAL) 55 MCG/ACT NA INHA: 2.0000 | Freq: Every day | NASAL | Status: DC

## 2012-04-09 NOTE — Assessment & Plan Note (Signed)
stable overall by hx and exam, most recent data reviewed with pt, and pt to continue medical treatment as before  Lab Results  Component Value Date   LDLCALC 71 03/09/2010    

## 2012-04-09 NOTE — Assessment & Plan Note (Signed)
C/w plantar fasciitis, for heel cushions and stretching excercises, [facility] podiatry

## 2012-04-09 NOTE — Assessment & Plan Note (Addendum)
S/p cabg 1991; ECG reviewed as per emr, stable overall by hx and exam, most recent data reviewed with pt, and pt to continue medical treatment as before Lab Results  Component Value Date   WBC 9.1 04/09/2011   HGB 14.7 04/09/2011   HCT 43.3 04/09/2011   PLT 249.0 04/09/2011   GLUCOSE 91 04/09/2011   CHOL 143 04/09/2011   TRIG 212.0* 04/09/2011   HDL 33.90* 04/09/2011   LDLDIRECT 79.3 04/09/2011   LDLCALC 71 03/09/2010   ALT 32 04/09/2011   AST 35 04/09/2011   NA 137 04/09/2011   K 3.9 04/09/2011   CL 105 04/09/2011   CREATININE 0.9 04/09/2011   BUN 17 04/09/2011   CO2 24 04/09/2011   TSH 1.49 04/09/2011   PSA 1.25 04/09/2011   HGBA1C 6.0 03/08/2009   Note:  Total time for pt hx, exam, review of record with pt in the room, determination of diagnoses and plan for further eval and tx is > 40 min, with over 50% spent in coordination and counseling of patient

## 2012-04-09 NOTE — Assessment & Plan Note (Signed)
Exam benign, for esr, delcines ortho referral at this time

## 2012-04-09 NOTE — Assessment & Plan Note (Signed)
Etiology unclear, Exam otherwise benign, to check labs as documented, follow with expectant management  

## 2012-04-09 NOTE — Patient Instructions (Addendum)
You had the flu shot, and the shingles shot Please go to LAB in the Basement for the blood and/or urine tests to be done today You will be contacted by phone if any changes need to be made immediately.  Otherwise, you will receive a letter about your results with an explanation. Please remember to sign up for My Chart at your earliest convenience, as this will be important to you in the future with finding out test results. Your EKG was good today You are otherwise up to date with prevention You would be due for colonoscopy next year Please call if you need referral for shoulders to Dr Ranell Patrick, or podiatry for the heel ([facility]), or ENT for the voice ([facility]) Please return in 1 year for your yearly visit, or sooner if needed

## 2012-04-09 NOTE — Assessment & Plan Note (Signed)
Asympt, also due for PSA

## 2012-04-09 NOTE — Progress Notes (Signed)
Subjective:    Patient ID: Brian Branch, male    DOB: [birth year], 71 y.o.   MRN: [id number]  HPI  Here to f/u; overall doing ok,  Pt denies chest pain, increased sob or doe, wheezing, orthopnea, PND, increased LE swelling, palpitations, dizziness or syncope.  Pt denies new neurological symptoms such as new headache, or facial or extremity weakness or numbness   Pt denies polydipsia, polyuria, or low sugar symptoms such as weakness or confusion improved with po intake.  Pt states overall good compliance with meds, trying to follow lower cholesterol diet, wt overall stable but little exercise, plans to do more.  Does have sense of ongoing fatigue, but denies signficant hypersomnolence.  Does have pain to both shoulders for 2 mo after heavy work but pain minor at this point, gradually improved, and has FROM.  Has some mild change in voice for 2 mo, seems deeper more course but denies hoarseness, cough, wt loss.  Has pain to the right heel, worse to the first few steps in the AM, better later in the day.  Due for flu shot, states also his suppl [facility] insurance pays for shingles shot, wants this as well. Past Medical History  Diagnosis Date  . ALLERGIC RHINITIS 02/15/2007  . BENIGN PROSTATIC HYPERTROPHY 02/15/2007  . COLONIC POLYPS, HX OF 08/29/2007  . CORONARY ARTERY DISEASE 02/15/2007  . 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 DEPRESSION 08/29/2007  . FATIGUE 08/29/2007  . HYPERLIPIDEMIA 02/15/2007  . INSOMNIA-SLEEP DISORDER-UNSPEC 03/09/2010  . KNEE PAIN, LEFT 08/29/2007  . 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 SHINGLES, 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  . TOBACCO ABUSE 03/09/2010  . Unspecified eustachian tube disorder 03/09/2010  . CAD (coronary artery disease)   . ED (erectile dysfunction)    Past Surgical History  Procedure Date  . Appendectomy 1951  . Coronary artery bypass graft 1991    reports that he quit smoking about 3 years ago. He does not have any smokeless tobacco history on file. He  reports that he drinks alcohol. He reports that he does not use illicit drugs. family history includes Arthritis in his mother and Heart disease in his father. Allergies  Allergen Reactions  . Levitra (Vardenafil Hydrochloride)     headache  . Penicillins     REACTION: Hives  . Tadalafil     REACTION: headache  . Triazolam     REACTION: hallucinations   Current Outpatient Prescriptions on File Prior to Visit  Medication Sig Dispense Refill  . DISCONTD: rosuvastatin (CRESTOR) 20 MG tablet Take 1 tablet (20 mg total) by mouth daily.  90 tablet  3  . DISCONTD: triamcinolone (NASACORT) 55 MCG/ACT nasal inhaler Place 2 sprays into the nose daily.  3 Inhaler  3   Review of Systems Review of Systems  Constitutional: Negative for diaphoresis, activity change, appetite change and unexpected weight change.  HENT: Negative for hearing loss, ear pain, facial swelling, mouth sores and neck stiffness.   Eyes: Negative for pain, redness and visual disturbance.  Respiratory: Negative for shortness of breath and wheezing.   Cardiovascular: Negative for chest pain and palpitations.  Gastrointestinal: Negative for diarrhea, blood in stool, abdominal distention and rectal pain.  Genitourinary: Negative for hematuria, flank pain and decreased urine volume.  Musculoskeletal: Negative for myalgias and joint swelling.  Skin: Negative for color change and wound.  Neurological: Negative for syncope and numbness.  Hematological: Negative for adenopathy.  Psychiatric/Behavioral: Negative for hallucinations, self-injury, decreased concentration and agitation.  Objective:   Physical Exam BP 128/70  Pulse 66  Temp 97.4 F (36.3 C) (Oral)  Ht 5\' 6"  (1.676 m)  Wt 175 lb 9 oz (79.635 kg)  BMI 28.34 kg/m2  SpO2 97% Physical Exam  VS noted Constitutional: Pt appears well-developed and well-nourished.  HENT: Head: Normocephalic.  Right Ear: External ear normal.  Left Ear: External ear normal.  Eyes:  Conjunctivae and EOM are normal. Pupils are equal, round, and reactive to light.  Neck: Normal range of motion. Neck supple.  Cardiovascular: Normal rate and regular rhythm.   Pulmonary/Chest: Effort normal and breath sounds normal.  Abd:  Soft, NT, non-distended, + BS Neurological: Pt is alert. Not confused  Skin: Skin is warm. No erythema.  Psychiatric: Pt behavior is normal. Thought content normal.     Assessment & Plan:

## 2012-04-09 NOTE — Assessment & Plan Note (Signed)
Some ? Gravely voice today , d/w pt need for ENT eval, declines at this time

## 2012-04-10 LAB — LDL CHOLESTEROL, DIRECT: Direct LDL: 79 mg/dL

## 2012-04-11 ENCOUNTER — Encounter: Payer: Self-pay | Admitting: Internal Medicine

## 2012-11-24 ENCOUNTER — Encounter: Payer: Self-pay | Admitting: Gastroenterology

## 2013-04-10 ENCOUNTER — Encounter: Payer: Self-pay | Admitting: Internal Medicine

## 2013-04-10 ENCOUNTER — Other Ambulatory Visit (INDEPENDENT_AMBULATORY_CARE_PROVIDER_SITE_OTHER): Payer: Medicare Other

## 2013-04-10 ENCOUNTER — Ambulatory Visit (INDEPENDENT_AMBULATORY_CARE_PROVIDER_SITE_OTHER)
Admission: RE | Admit: 2013-04-10 | Discharge: 2013-04-10 | Disposition: A | Discharge status: Home / Self Care | Payer: Medicare Other | Source: Ambulatory Visit | Attending: Internal Medicine | Admitting: Internal Medicine

## 2013-04-10 ENCOUNTER — Ambulatory Visit (INDEPENDENT_AMBULATORY_CARE_PROVIDER_SITE_OTHER): Payer: Medicare Other | Admitting: Internal Medicine

## 2013-04-10 VITALS — BP 118/82 | HR 68 | Temp 97.9°F | Ht 66.0 in | Wt 174.0 lb

## 2013-04-10 DIAGNOSIS — F3289 Other specified depressive episodes: Secondary | ICD-10-CM

## 2013-04-10 DIAGNOSIS — Z23 Encounter for immunization: Secondary | ICD-10-CM

## 2013-04-10 DIAGNOSIS — N320 Bladder-neck obstruction: Secondary | ICD-10-CM | POA: Diagnosis not present

## 2013-04-10 DIAGNOSIS — E785 Hyperlipidemia, unspecified: Secondary | ICD-10-CM

## 2013-04-10 DIAGNOSIS — M25551 Pain in right hip: Secondary | ICD-10-CM

## 2013-04-10 DIAGNOSIS — R5383 Other fatigue: Secondary | ICD-10-CM

## 2013-04-10 DIAGNOSIS — M25559 Pain in unspecified hip: Secondary | ICD-10-CM | POA: Diagnosis not present

## 2013-04-10 DIAGNOSIS — Z136 Encounter for screening for cardiovascular disorders: Secondary | ICD-10-CM

## 2013-04-10 DIAGNOSIS — F329 Major depressive disorder, single episode, unspecified: Secondary | ICD-10-CM

## 2013-04-10 DIAGNOSIS — R5381 Other malaise: Secondary | ICD-10-CM

## 2013-04-10 DIAGNOSIS — M169 Osteoarthritis of hip, unspecified: Secondary | ICD-10-CM | POA: Diagnosis not present

## 2013-04-10 LAB — BASIC METABOLIC PANEL
BUN: 15 mg/dL (ref 6–23)
CO2: 28 mEq/L (ref 19–32)
Calcium: 9.5 mg/dL (ref 8.4–10.5)
Chloride: 102 mEq/L (ref 96–112)
Creatinine, Ser: 1 mg/dL (ref 0.4–1.5)

## 2013-04-10 LAB — CBC WITH DIFFERENTIAL/PLATELET
Eosinophils Absolute: 0.3 10*3/uL (ref 0.0–0.7)
HCT: 44.3 % (ref 39.0–52.0)
Lymphs Abs: 2.5 10*3/uL (ref 0.7–4.0)
MCHC: 34.2 g/dL (ref 30.0–36.0)
MCV: 92.4 fl (ref 78.0–100.0)
Monocytes Absolute: 0.7 10*3/uL (ref 0.1–1.0)
Neutrophils Relative %: 65.3 % (ref 43.0–77.0)
Platelets: 259 10*3/uL (ref 150.0–400.0)
RDW: 13 % (ref 11.5–14.6)

## 2013-04-10 LAB — LIPID PANEL
Cholesterol: 148 mg/dL (ref 0–200)
HDL: 37.4 mg/dL — ABNORMAL LOW (ref >39.00)
Total CHOL/HDL Ratio: 4
Triglycerides: 176 mg/dL — ABNORMAL HIGH (ref 0.0–149.0)

## 2013-04-10 LAB — URINALYSIS, ROUTINE W REFLEX MICROSCOPIC
Bilirubin Urine: NEGATIVE
Hgb urine dipstick: NEGATIVE
Ketones, ur: NEGATIVE
Leukocytes, UA: NEGATIVE
Nitrite: NEGATIVE
pH: 6 (ref 5.0–8.0)

## 2013-04-10 LAB — HEPATIC FUNCTION PANEL
ALT: 40 U/L (ref 0–53)
AST: 35 U/L (ref 0–37)
Alkaline Phosphatase: 39 U/L (ref 39–117)
Bilirubin, Direct: 0.2 mg/dL (ref 0.0–0.3)
Total Bilirubin: 0.8 mg/dL (ref 0.3–1.2)

## 2013-04-10 LAB — PSA: PSA: 1.58 ng/mL (ref 0.10–4.00)

## 2013-04-10 LAB — TSH: TSH: 0.97 u[IU]/mL (ref 0.35–5.50)

## 2013-04-10 MED ORDER — TRIAMCINOLONE ACETONIDE 0.1 % EX CREA: TOPICAL_CREAM | Freq: Two times a day (BID) | CUTANEOUS | Status: DC

## 2013-04-10 MED ORDER — ROSUVASTATIN CALCIUM 20 MG PO TABS: 20.0000 mg | ORAL_TABLET | Freq: Every day | ORAL | Status: DC

## 2013-04-10 MED ORDER — TRIAMCINOLONE ACETONIDE(NASAL) 55 MCG/ACT NA INHA: 2.0000 | Freq: Every day | NASAL | Status: DC

## 2013-04-10 NOTE — Patient Instructions (Addendum)
You had the flu shot today  Please take all new medication as prescribed - the cream for the nose  Please continue all other medications as before, and refills have been done if requested.  Please go to the XRAY Department in the Basement (go straight as you get off the elevator)  for the x-ray testing  Please go to the LAB in the Basement (turn left off the elevator) for the tests to be done today  Please continue your efforts at being more active, low cholesterol diet, and weight control. You are otherwise up to date with prevention measures today.  Please return in 1 year for your yearly visit, or sooner if needed  You will be contacted regarding the referral for: Dr Smith/sports med for the right hip

## 2013-04-10 NOTE — Progress Notes (Signed)
Subjective:    Patient ID: Brian Branch, male    DOB: 29-Nov-1940, 72 y.o.   MRN: [id number]  HPI   Here for yearly f/u;  Overall doing ok;  Pt denies CP, worsening SOB, DOE, wheezing, orthopnea, PND, worsening LE edema, palpitations, dizziness or syncope.  Pt denies neurological change such as new headache, facial or extremity weakness.  Pt denies polydipsia, polyuria, or low sugar symptoms. Pt states overall good compliance with treatment and medications, good tolerability, and has been trying to follow lower cholesterol diet.  Pt denies worsening depressive symptoms, suicidal ideation or panic. No fever, night sweats, wt loss, loss of appetite, or other constitutional symptoms.  Pt states good ability with ADL's, has low fall risk, home safety reviewed and adequate, no other significant changes in hearing or vision, and only occasionally active with exercise.Does have ongoing right > left knee pain for 3-4 mo, mild usually but the right now worse to mod some days, has decreased ext rotation like movements, without back or other leg pain.  Worse to walk, better to sit.  Does c/o ongoing fatigue, but denies signficant daytime hypersomnolence. Has had mild worsening depressive symptoms, no suicidal ideation, or panic . Denies urinary symptoms such as dysuria, frequency, urgency, flank pain, hematuria or n/v, fever, chills.  Past Medical History  Diagnosis Date  . ALLERGIC RHINITIS 02/15/2007  . BENIGN PROSTATIC HYPERTROPHY 02/15/2007  . COLONIC POLYPS, HX OF 08/29/2007  . CORONARY ARTERY DISEASE 02/15/2007  . 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 DEPRESSION 08/29/2007  . FATIGUE 08/29/2007  . HYPERLIPIDEMIA 02/15/2007  . INSOMNIA-SLEEP DISORDER-UNSPEC 03/09/2010  . KNEE PAIN, LEFT 08/29/2007  . 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 SHINGLES, 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  . TOBACCO ABUSE 03/09/2010  . Unspecified eustachian tube disorder 03/09/2010  . CAD (coronary artery disease)   . ED (erectile  dysfunction)    Past Surgical History  Procedure Laterality Date  . Appendectomy  1951  . Coronary artery bypass graft  1991    reports that he quit smoking about 4 years ago. He does not have any smokeless tobacco history on file. He reports that  drinks alcohol. He reports that he does not use illicit drugs. family history includes Arthritis in his mother; Heart disease in his father. Allergies  Allergen Reactions  . Levitra [Vardenafil Hydrochloride]     headache  . Penicillins     REACTION: Hives  . Tadalafil     REACTION: headache  . Triazolam     REACTION: hallucinations   Current Outpatient Prescriptions on File Prior to Visit  Medication Sig Dispense Refill  . aspirin 81 MG EC tablet Take 1 tablet (81 mg total) by mouth daily. Swallow whole.  30 tablet  12   No current facility-administered medications on file prior to visit.   Review of Systems Constitutional: Negative for diaphoresis, activity change, appetite change or unexpected weight change.  HENT: Negative for hearing loss, ear pain, facial swelling, mouth sores and neck stiffness.   Eyes: Negative for pain, redness and visual disturbance.  Respiratory: Negative for shortness of breath and wheezing.   Cardiovascular: Negative for chest pain and palpitations.  Gastrointestinal: Negative for diarrhea, blood in stool, abdominal distention or other pain Genitourinary: Negative for hematuria, flank pain or change in urine volume.  Musculoskeletal: Negative for myalgias and joint swelling.  Skin: Negative for color change and wound.  Neurological: Negative for syncope and numbness. other than noted Hematological: Negative for adenopathy.  Psychiatric/Behavioral:  Negative for hallucinations, self-injury, decreased concentration and agitation.      Objective:   Physical Exam BP 118/82  Pulse 68  Temp(Src) 97.9 F (36.6 C) (Oral)  Ht 5\' 6"  (1.676 m)  Wt 174 lb (78.926 kg)  BMI 28.1 kg/m2  SpO2 97% VS noted,   Constitutional: Pt is oriented to person, place, and time. Appears well-developed and well-nourished.  Head: Normocephalic and atraumatic.  Right Ear: External ear normal.  Left Ear: External ear normal.  Nose: Nose normal.  Mouth/Throat: Oropharynx is clear and moist.  Eyes: Conjunctivae and EOM are normal. Pupils are equal, round, and reactive to light.  Neck: Normal range of motion. Neck supple. No JVD present. No tracheal deviation present.  Cardiovascular: Normal rate, regular rhythm, normal heart sounds and intact distal pulses.   Pulmonary/Chest: Effort normal and breath sounds normal.  Abdominal: Soft. Bowel sounds are normal. There is no tenderness. No HSM  Musculoskeletal: Normal range of motion except decreased ROM ext rotation right hip. Exhibits no edema.  Lymphadenopathy:  Has no cervical adenopathy.  Neurological: Pt is alert and oriented to person, place, and time. Pt has normal reflexes. No cranial nerve deficit.  Skin: Skin is warm and dry. No rash noted.  Psychiatric:  ? Mild dysphoric. Behavior is normal.     Assessment & Plan:

## 2013-04-10 NOTE — Assessment & Plan Note (Addendum)
ECG reviewed as per emr, stable overall by history and exam, recent data reviewed with pt, and pt to continue medical treatment as before,  to f/u any worsening symptoms or concerns Lab Results  Component Value Date   LDLCALC 75 04/10/2013

## 2013-04-12 NOTE — Assessment & Plan Note (Signed)
Mild persistent ongoing, declines specific tx or referral

## 2013-04-12 NOTE — Assessment & Plan Note (Addendum)
Etiology unclear, Exam otherwise benign, to check labs as documented, follow with expectant management  Note:  Total time for pt hx, exam, review of record with pt in the room, determination of diagnoses and plan for further eval and tx is > 40 min, with over 50% spent in coordination and counseling of patient  

## 2013-04-12 NOTE — Assessment & Plan Note (Signed)
Also for psa as he is due 

## 2013-04-21 ENCOUNTER — Ambulatory Visit: Payer: Medicare Other | Admitting: Family Medicine

## 2013-04-24 ENCOUNTER — Encounter: Payer: Self-pay | Admitting: Family Medicine

## 2013-04-24 ENCOUNTER — Ambulatory Visit (INDEPENDENT_AMBULATORY_CARE_PROVIDER_SITE_OTHER): Payer: Medicare Other | Admitting: Family Medicine

## 2013-04-24 VITALS — BP 122/72 | HR 58 | Wt 172.0 lb

## 2013-04-24 DIAGNOSIS — M1611 Unilateral primary osteoarthritis, right hip: Secondary | ICD-10-CM

## 2013-04-24 DIAGNOSIS — M169 Osteoarthritis of hip, unspecified: Secondary | ICD-10-CM | POA: Diagnosis not present

## 2013-04-24 NOTE — Progress Notes (Signed)
I'm seeing this patient by the request  of:  Dr. Jonny Ruiz  CC: Right hip pain  HPI: Patient is a very pleasant 72 year old male coming in with a complaint of right hip pain. Patient states he has had this pain for quite some time and would states in multiple months. Patient number is more of an insidious onset. No injury noted. Patient states that the pain seems to be more on the anterior thigh and does radiate into his groin with certain movements. Patient does notice he's lost significant amount of flexibility on this side compared to his contralateral side. Patient denies any radiation down the leg past the knee and denies any numbness. Patient though does notice he does things such as avoid stairs secondary to pain. Patient does wake up at night but does not know if it is related to the hip pain itself. Patient has tried some over-the-counter Aleve which does seem to take the edge off. Patient is a severity of approximately 6-7/10.  Past medical, surgical, family and social history reviewed. Medications reviewed all in the electronic medical record.   Review of Systems: No headache, visual changes, nausea, vomiting, diarrhea, constipation, dizziness, abdominal pain, skin rash, fevers, chills, night sweats, weight loss, swollen lymph nodes, body aches, joint swelling, muscle aches, chest pain, shortness of breath, mood changes.   Objective:    Blood pressure 122/72, pulse 58, weight 172 lb (78.019 kg), SpO2 97.00%.   General: No apparent distress alert and oriented x3 mood and affect normal, dressed appropriately.  HEENT: Pupils equal, extraocular movements intact Respiratory: Patient's speak in full sentences and does not appear short of breath Cardiovascular: No lower extremity edema, non tender, no erythema Skin: Warm dry intact with no signs of infection or rash on extremities or on axial skeleton. Abdomen: Soft nontender Neuro: Cranial nerves II through XII are intact, neurovascularly  intact in all extremities with 2+ DTRs and 2+ pulses. Lymph: No lymphadenopathy of posterior or anterior cervical chain or axillae bilaterally.  Gait normal with good balance and coordination.  MSK: Non tender with full range of motion and good stability and symmetric strength and tone of shoulders, elbows, wrist, knee and ankles bilaterally.  Hip: right ROM IR: 15 Deg, ER: 30 Deg, Flexion: 100 Deg, Extension: 100 Deg, Abduction: 45 Deg, Adduction: 45 Deg Strength IR: 3/5, ER: 5/5, Flexion: 4/5, Extension: 5/5, Abduction: 4/5, Adduction: 4/5 Pelvic alignment unremarkable to inspection and palpation. Standing hip rotation and gait without trendelenburg sign / unsteadiness. Greater trochanter without tenderness to palpation. No tenderness over piriformis and greater trochanter. Significant pain with FABER and especially with FADIR. No SI joint tenderness and normal minimal SI movement. Hip: left ROM IR: 45 Deg, ER: 45 Deg, Flexion: 120 Deg, Extension: 100 Deg, Abduction: 45 Deg, Adduction: 45 Deg Strength IR: 5/5, ER: 5/5, Flexion: 5/5, Extension: 5/5, Abduction: 5/5, Adduction: 5/5 Pelvic alignment unremarkable to inspection and palpation. Standing hip rotation and gait without trendelenburg sign / unsteadiness. Greater trochanter without tenderness to palpation. No tenderness over piriformis and greater trochanter. No pain with FABER or FADIR. No SI joint tenderness and normal minimal SI movement.  X-rays were reviewed by me today. 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 Most severe in the right side compared to the left side. Patient does have sclerotic changes of the anterior inferior portion of the humeral head as well as the superior aspect of the acetabulum.   Impression  and Recommendations:     This case required medical decision making of moderate complexity.

## 2013-04-24 NOTE — Assessment & Plan Note (Signed)
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 Patient's x-rays were non-weightbearing and likely is even worse than the x-ray show. Discussed different treatment options for the patient at this time. Patient will one ovoid anything such as hip replacement surgery at all costs. At this time the patient is going to try conservative therapy with over-the-counter medications, icing protocol, as well as home exercise program. Patient is going to do this as well as transition some of his exercises from less weightbearing do more cycling. Patient and will come back and see me again in 2-3 weeks. If he continues to have pain we would consider doing an intra-articular hip injection as well as under ultrasound guidance.

## 2013-04-24 NOTE — Patient Instructions (Addendum)
Very nice to meet you We will try some medications Take tylenol 650 mg three times a day is the best evidence based medicine we have for arthritis.  Aleve 1-2 tabs twice a day with food or ibuprofen 600mg  twice daily with food can be added to tylenol.  Glucosamine sulfate 750mg  twice a day is a supplement that has been shown to help moderate to severe arthritis. Vitamin D 100 IU daily Fish Oil 3 grams daily is a natural anti-inflammatory.  Capsaicin topically up to four times a day may also help with pain. Cortisone injections are an option if these interventions do not seem to make a difference or need more relief.  It's important that you continue to stay active. Controlling your weight is important.  Consider physical therapy to strengthen muscles around the joint that hurts to take pressure off of the joint itself. Heat or ice 20 minutes at a time 3-4 times a day as needed to help with pain. Water aerobics and cycling with low resistance are the best two types of exercise for arthritis. Come back and see me in 2-3 weeks and if still in pain then consider injection.  WILL NEED 30 MINUTE APPT.

## 2013-05-12 ENCOUNTER — Other Ambulatory Visit: Payer: Self-pay

## 2013-05-12 MED ORDER — ROSUVASTATIN CALCIUM 20 MG PO TABS: 20.0000 mg | ORAL_TABLET | Freq: Every day | ORAL | Status: DC

## 2013-05-12 MED ORDER — ASPIRIN 81 MG PO TBEC: 81.0000 mg | DELAYED_RELEASE_TABLET | Freq: Every day | ORAL | Status: DC

## 2013-05-15 ENCOUNTER — Other Ambulatory Visit (INDEPENDENT_AMBULATORY_CARE_PROVIDER_SITE_OTHER): Payer: Medicare Other

## 2013-05-15 ENCOUNTER — Encounter: Payer: Self-pay | Admitting: Family Medicine

## 2013-05-15 ENCOUNTER — Ambulatory Visit (INDEPENDENT_AMBULATORY_CARE_PROVIDER_SITE_OTHER): Payer: Medicare Other | Admitting: Family Medicine

## 2013-05-15 VITALS — BP 136/80 | HR 61 | Wt 172.0 lb

## 2013-05-15 DIAGNOSIS — M169 Osteoarthritis of hip, unspecified: Secondary | ICD-10-CM | POA: Diagnosis not present

## 2013-05-15 DIAGNOSIS — M25551 Pain in right hip: Secondary | ICD-10-CM

## 2013-05-15 DIAGNOSIS — M25559 Pain in unspecified hip: Secondary | ICD-10-CM

## 2013-05-15 DIAGNOSIS — M1611 Unilateral primary osteoarthritis, right hip: Secondary | ICD-10-CM

## 2013-05-15 DIAGNOSIS — M1610 Unilateral primary osteoarthritis, unspecified hip: Secondary | ICD-10-CM

## 2013-05-15 NOTE — Progress Notes (Signed)
  CC: Right hip pain followup  HPI: Patient is returning for followup of his right hip pain. Patient was found to have arthritic changes of his hip with significant decrease in internal rotation. Patient was given home exercises, over-the-counter medications, as well as discussing different changes in activity level that could be beneficial. Since that time patient states he has made minimal improvement. Patient states he is doing the exercises on a regular basis. Patient is taking naproxen with some moderate improvement.   Past medical, surgical, family and social history reviewed. Medications reviewed all in the electronic medical record.   Review of Systems: No headache, visual changes, nausea, vomiting, diarrhea, constipation, dizziness, abdominal pain, skin rash, fevers, chills, night sweats, weight loss, swollen lymph nodes, body aches, joint swelling, muscle aches, chest pain, shortness of breath, mood changes.   Objective:    Blood pressure 136/80, pulse 61, weight 172 lb (78.019 kg), SpO2 98.00%.   General: No apparent distress alert and oriented x3 mood and affect normal, dressed appropriately.  HEENT: Pupils equal, extraocular movements intact Respiratory: Patient's speak in full sentences and does not appear short of breath Cardiovascular: No lower extremity edema, non tender, no erythema Skin: Warm dry intact with no signs of infection or rash on extremities or on axial skeleton. Abdomen: Soft nontender Neuro: Cranial nerves II through XII are intact, neurovascularly intact in all extremities with 2+ DTRs and 2+ pulses. Lymph: No lymphadenopathy of posterior or anterior cervical chain or axillae bilaterally.  Gait normal with good balance and coordination.  MSK: Non tender with full range of motion and good stability and symmetric strength and tone of shoulders, elbows, wrist, knee and ankles bilaterally.  Hip: right ROM IR: 15 Deg, ER: 30 Deg, Flexion: 100 Deg, Extension: 100  Deg, Abduction: 45 Deg, Adduction: 45 Deg Strength IR: 3/5, ER: 5/5, Flexion: 4/5, Extension: 5/5, Abduction: 4/5, Adduction: 4/5 Pelvic alignment unremarkable to inspection and palpation. Standing hip rotation and gait without trendelenburg sign / unsteadiness. Greater trochanter without tenderness to palpation. No tenderness over piriformis and greater trochanter. Significant pain with FABER and especially with FADIR. No SI joint tenderness and normal minimal SI movement. Hip: left ROM IR: 25 Deg, ER: 45 Deg, Flexion: 120 Deg, Extension: 100 Deg, Abduction: 45 Deg, Adduction: 45 Deg Strength IR: 4/5, ER: 5/5, Flexion: 5/5, Extension: 5/5, Abduction: 5/5, Adduction: 5/5 Pelvic alignment unremarkable to inspection and palpation. Standing hip rotation and gait without trendelenburg sign / unsteadiness. Greater trochanter without tenderness to palpation. No tenderness over piriformis and greater trochanter. No pain with FABER or FADIR. No SI joint tenderness and normal minimal SI movement. Neurovascularly intact distally.  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 There appears to be no articular cartilage left of the anterior inferior portion. Patient does have a very large bone spur in this area. With internal rotation and does show significant impingement. Patient does have mild to moderate effusion of the anterior capsule noted. Patient does have close proximity of the great vessels to the joint area. These vessels seem to be somewhat superior and anterior than usual. Good Doppler flow.   Impression and Recommendations:     This case required medical decision making of moderate complexity.

## 2013-05-15 NOTE — Assessment & Plan Note (Signed)
Discussed with patient at great length. We did do the ultrasound that did show that he does have severe os to arthritic changes of the femoral acetabular joint. We would've done a injection but seeing the proximity of the large vessels patient did become anxious. At this time we will continue with conservative therapy but we will refer to formal physical therapy for range of motion exercises and strengthening. Patient will try home exercises as well as formal physical therapy and continue the naproxen that he is taking on a regular basis. Patient will try these and then come back in 4-6 weeks. If he continues to have trouble at that time then we would do a steroid injection.

## 2013-05-15 NOTE — Patient Instructions (Signed)
It is good to see you You vessels are in close proximity to your joint so I would like to avoid doing the injection. We can do it another time if needed.  Start those exercises daily.  We will put in order for physical therapy. They will call you Come back in 4-6 weeks

## 2014-04-16 ENCOUNTER — Telehealth: Payer: Self-pay | Admitting: Internal Medicine

## 2014-04-16 ENCOUNTER — Other Ambulatory Visit (INDEPENDENT_AMBULATORY_CARE_PROVIDER_SITE_OTHER): Payer: Medicare Other

## 2014-04-16 ENCOUNTER — Ambulatory Visit (INDEPENDENT_AMBULATORY_CARE_PROVIDER_SITE_OTHER): Payer: Medicare Other | Admitting: Internal Medicine

## 2014-04-16 ENCOUNTER — Encounter: Payer: Self-pay | Admitting: Internal Medicine

## 2014-04-16 VITALS — BP 142/82 | HR 64 | Temp 98.2°F | Ht 66.0 in | Wt 173.2 lb

## 2014-04-16 DIAGNOSIS — E785 Hyperlipidemia, unspecified: Secondary | ICD-10-CM | POA: Diagnosis not present

## 2014-04-16 DIAGNOSIS — I2510 Atherosclerotic heart disease of native coronary artery without angina pectoris: Secondary | ICD-10-CM

## 2014-04-16 DIAGNOSIS — F3289 Other specified depressive episodes: Secondary | ICD-10-CM | POA: Diagnosis not present

## 2014-04-16 DIAGNOSIS — N320 Bladder-neck obstruction: Secondary | ICD-10-CM

## 2014-04-16 DIAGNOSIS — Z23 Encounter for immunization: Secondary | ICD-10-CM | POA: Diagnosis not present

## 2014-04-16 DIAGNOSIS — F329 Major depressive disorder, single episode, unspecified: Secondary | ICD-10-CM

## 2014-04-16 DIAGNOSIS — Z86010 Personal history of colonic polyps: Secondary | ICD-10-CM

## 2014-04-16 LAB — HEPATIC FUNCTION PANEL
ALBUMIN: 4.4 g/dL (ref 3.5–5.2)
ALT: 25 U/L (ref 0–53)
AST: 27 U/L (ref 0–37)
Alkaline Phosphatase: 43 U/L (ref 39–117)
BILIRUBIN TOTAL: 0.8 mg/dL (ref 0.2–1.2)
Bilirubin, Direct: 0.1 mg/dL (ref 0.0–0.3)
Total Protein: 7.4 g/dL (ref 6.0–8.3)

## 2014-04-16 LAB — BASIC METABOLIC PANEL
BUN: 18 mg/dL (ref 6–23)
CO2: 25 mEq/L (ref 19–32)
CREATININE: 1.1 mg/dL (ref 0.4–1.5)
Calcium: 9.4 mg/dL (ref 8.4–10.5)
Chloride: 103 mEq/L (ref 96–112)
GFR: 73.75 mL/min (ref >60.00)
Glucose, Bld: 106 mg/dL — ABNORMAL HIGH (ref 70–99)
Potassium: 4.9 mEq/L (ref 3.5–5.1)
Sodium: 135 mEq/L (ref 135–145)

## 2014-04-16 LAB — CBC WITH DIFFERENTIAL/PLATELET
BASOS PCT: 0.5 % (ref 0.0–3.0)
Basophils Absolute: 0 10*3/uL (ref 0.0–0.1)
EOS PCT: 4 % (ref 0.0–5.0)
Eosinophils Absolute: 0.4 10*3/uL (ref 0.0–0.7)
HCT: 43.5 % (ref 39.0–52.0)
Hemoglobin: 14.8 g/dL (ref 13.0–17.0)
Lymphocytes Relative: 23 % (ref 12.0–46.0)
Lymphs Abs: 2.3 10*3/uL (ref 0.7–4.0)
MCHC: 34 g/dL (ref 30.0–36.0)
MCV: 92.9 fl (ref 78.0–100.0)
MONO ABS: 0.9 10*3/uL (ref 0.1–1.0)
MONOS PCT: 8.6 % (ref 3.0–12.0)
NEUTROS PCT: 63.9 % (ref 43.0–77.0)
Neutro Abs: 6.5 10*3/uL (ref 1.4–7.7)
Platelets: 238 10*3/uL (ref 150.0–400.0)
RBC: 4.68 Mil/uL (ref 4.22–5.81)
RDW: 12.7 % (ref 11.5–15.5)
WBC: 10.2 10*3/uL (ref 4.0–10.5)

## 2014-04-16 LAB — URINALYSIS, ROUTINE W REFLEX MICROSCOPIC
BILIRUBIN URINE: NEGATIVE
HGB URINE DIPSTICK: NEGATIVE
KETONES UR: NEGATIVE
Leukocytes, UA: NEGATIVE
Nitrite: NEGATIVE
RBC / HPF: NONE SEEN (ref 0–?)
Specific Gravity, Urine: <=1.005 — AB (ref 1.000–1.030)
TOTAL PROTEIN, URINE-UPE24: NEGATIVE
Urine Glucose: NEGATIVE
Urobilinogen, UA: 0.2 (ref 0.0–1.0)
WBC, UA: NONE SEEN (ref 0–?)
pH: 6 (ref 5.0–8.0)

## 2014-04-16 LAB — LIPID PANEL
CHOL/HDL RATIO: 4
Cholesterol: 140 mg/dL (ref 0–200)
HDL: 36 mg/dL — ABNORMAL LOW (ref >39.00)
LDL Cholesterol: 72 mg/dL (ref 0–99)
NonHDL: 104
Triglycerides: 161 mg/dL — ABNORMAL HIGH (ref 0.0–149.0)
VLDL: 32.2 mg/dL (ref 0.0–40.0)

## 2014-04-16 LAB — TSH: TSH: 1.38 u[IU]/mL (ref 0.35–4.50)

## 2014-04-16 LAB — PSA: PSA: 1.49 ng/mL (ref 0.10–4.00)

## 2014-04-16 MED ORDER — ROSUVASTATIN CALCIUM 20 MG PO TABS: 20.0000 mg | ORAL_TABLET | Freq: Every day | ORAL | Status: DC

## 2014-04-16 NOTE — Patient Instructions (Addendum)
You had the flu shot today  Please return in 2 weeks for the new Prevnar pneumonia shot with Nurse Visit  You will be contacted regarding the referral for: colonoscopy  Please continue all other medications as before, and refills have been done if requested - the crestor in hardcopy  Please have the pharmacy call with any other refills you may need.  Please continue your efforts at being more active, low cholesterol diet, and weight control.  You are otherwise up to date with prevention measures today.  Please keep your appointments with your specialists as you may have planned  Please go to the LAB in the Basement (turn left off the elevator) for the tests to be done today  You will be contacted by phone if any changes need to be made immediately.  Otherwise, you will receive a letter about your results with an explanation, but please check with MyChart first.  Please remember to sign up for MyChart if you have not done so, as this will be important to you in the future with finding out test results, communicating by private email, and scheduling acute appointments online when needed.  Please return in 1 year for your yearly visit, or sooner if needed

## 2014-04-16 NOTE — Assessment & Plan Note (Signed)
For f/u colonscopy as he is due 

## 2014-04-16 NOTE — Assessment & Plan Note (Addendum)
stable overall by history and exam, recent data reviewed with pt, and pt to continue medical treatment as before,  to f/u any worsening symptoms or concerns Lab Results  Component Value Date   Marion 75 04/10/2013   For f/u labs

## 2014-04-16 NOTE — Assessment & Plan Note (Signed)
stable overall by history and exam, recent data reviewed with pt, and pt to continue medical treatment as before,  to f/u any worsening symptoms or concerns Lab Results  Component Value Date   WBC 10.1 04/10/2013   HGB 15.1 04/10/2013   HCT 44.3 04/10/2013   PLT 259.0 04/10/2013   GLUCOSE 93 04/10/2013   CHOL 148 04/10/2013   TRIG 176.0* 04/10/2013   HDL 37.40* 04/10/2013   LDLDIRECT 79.0 04/09/2012   LDLCALC 75 04/10/2013   ALT 40 04/10/2013   AST 35 04/10/2013   NA 136 04/10/2013   K 4.6 04/10/2013   CL 102 04/10/2013   CREATININE 1.0 04/10/2013   BUN 15 04/10/2013   CO2 28 04/10/2013   TSH 0.97 04/10/2013   PSA 1.58 04/10/2013   HGBA1C 6.0 03/08/2009

## 2014-04-16 NOTE — Assessment & Plan Note (Signed)
stable overall by history and exam, and pt to continue medical treatment as before,  to f/u any worsening symptoms or concerns, declines need for tx

## 2014-04-16 NOTE — Progress Notes (Signed)
Subjective:    Patient ID: Brian Branch, male    DOB: [birth year], 72 y.o.   MRN: [id number]  HPI  Here for yearly f/u;  Overall doing ok;  Pt denies CP, worsening SOB, DOE, wheezing, orthopnea, PND, worsening LE edema, palpitations, dizziness or syncope.  Pt denies neurological change such as new headache, facial or extremity weakness.  Pt denies polydipsia, polyuria, or low sugar symptoms. Pt states overall good compliance with treatment and medications, good tolerability, and has been trying to follow lower cholesterol diet.  Pt denies worsening depressive symptoms, suicidal ideation or panic. No fever, night sweats, wt loss, loss of appetite, or other constitutional symptoms.  Pt states good ability with ADL's, has low fall risk, home safety reviewed and adequate, no other significant changes in hearing or vision, and only occasionally active with exercise. Has some clicking to left knee occas but no pain or swelling.  No current complaints Past Medical History  Diagnosis Date  . ALLERGIC RHINITIS 02/15/2007  . BENIGN PROSTATIC HYPERTROPHY 02/15/2007  . COLONIC POLYPS, HX OF 08/29/2007  . CORONARY ARTERY DISEASE 02/15/2007  . 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 DEPRESSION 08/29/2007  . FATIGUE 08/29/2007  . HYPERLIPIDEMIA 02/15/2007  . INSOMNIA-SLEEP DISORDER-UNSPEC 03/09/2010  . KNEE PAIN, LEFT 08/29/2007  . 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 SHINGLES, 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  . TOBACCO ABUSE 03/09/2010  . Unspecified eustachian tube disorder 03/09/2010  . CAD (coronary artery disease)   . ED (erectile dysfunction)    Past Surgical History  Procedure Laterality Date  . Appendectomy  1951  . Coronary artery bypass graft  1991    reports that he quit smoking about 5 years ago. He does not have any smokeless tobacco history on file. He reports that he drinks alcohol. He reports that he does not use illicit drugs. family history includes Arthritis in his mother; Heart disease  in his father. Allergies  Allergen Reactions  . Levitra [Vardenafil Hydrochloride]     headache  . Penicillins     REACTION: Hives  . Tadalafil     REACTION: headache  . Triazolam     REACTION: hallucinations   Current Outpatient Prescriptions on File Prior to Visit  Medication Sig Dispense Refill  . aspirin 81 MG EC tablet Take 1 tablet (81 mg total) by mouth daily. Swallow whole.  90 tablet  3  . rosuvastatin (CRESTOR) 20 MG tablet Take 1 tablet (20 mg total) by mouth daily.  90 tablet  3  . triamcinolone cream (KENALOG) 0.1 % Apply topically 2 (two) times daily. As needed  30 g  0  . acetaminophen (TYLENOL) 500 MG tablet Take 500 mg by mouth 3 (three) times daily.      . Boswellia-Glucosamine-Vit D (HM GLUCOSAMINE & VITAMIN D3) TABS Take 1 tablet by mouth 2 (two) times daily.      . naproxen sodium (ANAPROX) 220 MG tablet Take 440 mg by mouth 2 (two) times daily with a meal.       No current facility-administered medications on file prior to visit.    Review of Systems Constitutional: Negative for increased diaphoresis, other activity, appetite or other siginficant weight change  HENT: Negative for worsening hearing loss, ear pain, facial swelling, mouth sores and neck stiffness.   Eyes: Negative for other worsening pain, redness or visual disturbance.  Respiratory: Negative for shortness of breath and wheezing.   Cardiovascular: Negative for chest pain and palpitations.  Gastrointestinal: Negative for diarrhea,  blood in stool, abdominal distention or other pain Genitourinary: Negative for hematuria, flank pain or change in urine volume.  Musculoskeletal: Negative for myalgias or other joint complaints.  Skin: Negative for color change and wound.  Neurological: Negative for syncope and numbness. other than noted Hematological: Negative for adenopathy. or other swelling Psychiatric/Behavioral: Negative for hallucinations, self-injury, decreased concentration or other worsening  agitation.      Objective:   Physical Exam BP 142/82  Pulse 64  Temp(Src) 98.2 F (36.8 C) (Oral)  Ht 5\' 6"  (1.676 m)  Wt 173 lb 4 oz (78.586 kg)  BMI 27.98 kg/m2  SpO2 97% VS noted,  Constitutional: Pt is oriented to person, place, and time. Appears well-developed and well-nourished. Annabell Sabal Head: Normocephalic and atraumatic.  Right Ear: External ear normal.  Left Ear: External ear normal.  Nose: Nose normal.  Mouth/Throat: Oropharynx is clear and moist.  Eyes: Conjunctivae and EOM are normal. Pupils are equal, round, and reactive to light.  Neck: Normal range of motion. Neck supple. No JVD present. No tracheal deviation present.  Cardiovascular: Normal rate, regular rhythm, normal heart sounds and intact distal pulses.   Pulmonary/Chest: Effort normal and breath sounds without rales or wheezing  Abdominal: Soft. Bowel sounds are normal. NT. No HSM  Musculoskeletal: Normal range of motion. Exhibits no edema.  Lymphadenopathy:  Has no cervical adenopathy.  Neurological: Pt is alert and oriented to person, place, and time. Pt has normal reflexes. No cranial nerve deficit. Motor grossly intact Skin: Skin is warm and dry. No rash noted.  Psychiatric:  Has mild dysphoric mood and affect. Behavior is normal.     Assessment & Plan:

## 2014-04-16 NOTE — Assessment & Plan Note (Signed)
Asympt, for f/u psa as he is due 

## 2014-04-16 NOTE — Progress Notes (Signed)
Pre visit review using our clinic review tool, if applicable. No additional management support is needed unless otherwise documented below in the visit note. 

## 2014-04-16 NOTE — Addendum Note (Signed)
Addended by: Biagio Borg on: 04/16/2014 10:17 AM   Modules accepted: Orders

## 2014-04-16 NOTE — Telephone Encounter (Signed)
Patient has appointment for Prevnar on 10/9

## 2014-04-30 ENCOUNTER — Encounter: Payer: Self-pay | Admitting: Gastroenterology

## 2014-04-30 ENCOUNTER — Ambulatory Visit (INDEPENDENT_AMBULATORY_CARE_PROVIDER_SITE_OTHER): Payer: Medicare Other

## 2014-04-30 DIAGNOSIS — Z23 Encounter for immunization: Secondary | ICD-10-CM

## 2014-06-25 ENCOUNTER — Ambulatory Visit (AMBULATORY_SURGERY_CENTER): Payer: Self-pay | Admitting: *Deleted

## 2014-06-25 VITALS — Ht 66.0 in | Wt 174.8 lb

## 2014-06-25 DIAGNOSIS — Z86010 Personal history of colonic polyps: Secondary | ICD-10-CM

## 2014-06-25 MED ORDER — MOVIPREP 100 G PO SOLR: 1.0000 | Freq: Once | ORAL | Status: DC

## 2014-06-25 NOTE — Progress Notes (Signed)
Denies allergies to eggs or soy products. Denies complications with sedation or anesthesia. Denies O2 use. Denies use of diet or weight loss medications.  Unable to give Emmi instructions given for colonoscopy due to internet downtime.

## 2014-07-09 ENCOUNTER — Ambulatory Visit (AMBULATORY_SURGERY_CENTER): Payer: Medicare Other | Admitting: Gastroenterology

## 2014-07-09 ENCOUNTER — Encounter: Payer: Self-pay | Admitting: Gastroenterology

## 2014-07-09 VITALS — BP 118/70 | HR 59 | Temp 97.5°F | Resp 21 | Ht 66.0 in | Wt 174.0 lb

## 2014-07-09 DIAGNOSIS — D123 Benign neoplasm of transverse colon: Secondary | ICD-10-CM

## 2014-07-09 DIAGNOSIS — Z86010 Personal history of colonic polyps: Secondary | ICD-10-CM

## 2014-07-09 DIAGNOSIS — D122 Benign neoplasm of ascending colon: Secondary | ICD-10-CM

## 2014-07-09 DIAGNOSIS — Z951 Presence of aortocoronary bypass graft: Secondary | ICD-10-CM | POA: Diagnosis not present

## 2014-07-09 DIAGNOSIS — E669 Obesity, unspecified: Secondary | ICD-10-CM | POA: Diagnosis not present

## 2014-07-09 DIAGNOSIS — I2510 Atherosclerotic heart disease of native coronary artery without angina pectoris: Secondary | ICD-10-CM | POA: Diagnosis not present

## 2014-07-09 DIAGNOSIS — I252 Old myocardial infarction: Secondary | ICD-10-CM | POA: Diagnosis not present

## 2014-07-09 MED ORDER — SODIUM CHLORIDE 0.9 % IV SOLN: 500.0000 mL | INTRAVENOUS | Status: DC

## 2014-07-09 NOTE — Progress Notes (Signed)
Report to PACU, RN, vss, BBS= Clear.  

## 2014-07-09 NOTE — Op Note (Signed)
[place]  [address] [place], [address]   COLONOSCOPY PROCEDURE REPORT  PATIENT: Brian Branch, Brian Branch  MR#: [id number] BIRTHDATE: 05/29/1941 , 73  yrs. old GENDER: male ENDOSCOPIST: Ladene Artist, MD, [facility] PROCEDURE DATE:  07/09/2014 PROCEDURE:   Colonoscopy with biopsy and Colonoscopy with snare polypectomy First Screening Colonoscopy - Avg.  risk and is 50 yrs.  old or older - No.  Prior Negative Screening - Now for repeat screening. N/A  History of Adenoma - Now for follow-up colonoscopy & has been > or = to 3 yrs.  Yes hx of adenoma.  Has been 3 or more years since last colonoscopy.  Polyps Removed Today? Yes. ASA CLASS:   Class III INDICATIONS:surveillance colonoscopy based on a history of adenomatous colonic polyp(s). MEDICATIONS: Monitored anesthesia care and Propofol 200 mg IV DESCRIPTION OF PROCEDURE:   After the risks benefits and alternatives of the procedure were thoroughly explained, informed consent was obtained.  The digital rectal exam revealed no abnormalities of the rectum.   The [id number]  endoscope was introduced through the anus and advanced to the cecum, which was identified by both the appendix and ileocecal valve. No adverse events experienced.   The quality of the prep was good, using MoviPrep  The instrument was then slowly withdrawn as the colon was fully examined.  COLON FINDINGS: A sessile polyp measuring 6 mm in size was found in the ascending colon.  A polypectomy was performed with a cold snare.  The resection was complete, the polyp tissue was completely retrieved and sent to histology.   Three sessile polyps measuring 6-7 mm in size were found in the transverse colon.  Polypectomies were performed with a cold snare.  The resection was complete, the polyp tissue was completely retrieved and sent to histology.   A sessile polyp measuring 5 mm in size was found in the transverse colon.  A polypectomy was performed  with cold forceps.  The resection was complete, the polyp tissue was completely retrieved and sent to histology.   There was moderate diverticulosis noted in the left colon.   The examination was otherwise normal. Retroflexed views revealed no abnormalities. The time to cecum=1 minutes 38 seconds.  Withdrawal time=11 minutes 47 seconds.  The scope was withdrawn and the procedure completed. COMPLICATIONS: There were no immediate complications. ENDOSCOPIC IMPRESSION: 1.   Sessile polyp in the ascending colon; polypectomy performed with a cold snare 2.   Three sessile polyps in the transverse colon; polypectomies performed with a cold snare 3.   Sessile polyp in the transverse colon; polypectomy performed with cold forceps 4.   Moderate diverticulosis in the left colon  RECOMMENDATIONS: 1.  Hold Aspirin and all other NSAIDS for 2 weeks. 2.  Await pathology results 3.  High fiber diet with liberal fluid intake. 4.  Repeat colonoscopy in 3 years if 3-5 polyps adenomatous; otherwise 5 years  eSigned:  Ladene Artist, MD, [facility] 07/09/2014 8:31 AM

## 2014-07-09 NOTE — Progress Notes (Signed)
Dr. Fuller Plan told the patient to get an EKG with in a month due to the irregular heart rate in the procedure room. PVC's noted.  Patient does have a cardiac history. Patient agreed.

## 2014-07-09 NOTE — Progress Notes (Signed)
Called to room to assist during endoscopic procedure.  Patient ID and intended procedure confirmed with present staff. Received instructions for my participation in the procedure from the performing physician.  

## 2014-07-09 NOTE — Patient Instructions (Signed)
YOU HAD AN ENDOSCOPIC PROCEDURE TODAY AT [facility]: Refer to the procedure report that was given to you for any specific questions about what was found during the examination.  If the procedure report does not answer your questions, please call your gastroenterologist to clarify.  If you requested that your care partner not be given the details of your procedure findings, then the procedure report has been included in a sealed envelope for you to review at your convenience later.  YOU SHOULD EXPECT: Some feelings of bloating in the abdomen. Passage of more gas than usual.  Walking can help get rid of the air that was put into your GI tract during the procedure and reduce the bloating. If you had a lower endoscopy (such as a colonoscopy or flexible sigmoidoscopy) you may notice spotting of blood in your stool or on the toilet paper. If you underwent a bowel prep for your procedure, then you may not have a normal bowel movement for a few days.  DIET: Your first meal following the procedure should be a light meal and then it is ok to progress to your normal diet.  A half-sandwich or bowl of soup is an example of a good first meal.  Heavy or fried foods are harder to digest and may make you feel nauseous or bloated.  Likewise meals heavy in dairy and vegetables can cause extra gas to form and this can also increase the bloating.  Drink plenty of fluids but you should avoid alcoholic beverages for 24 hours. Try to eat a high fiber diet and increase your water intake.  ACTIVITY: Your care partner should take you home directly after the procedure.  You should plan to take it easy, moving slowly for the rest of the day.  You can resume normal activity the day after the procedure however you should NOT DRIVE or use heavy machinery for 24 hours (because of the sedation medicines used during the test).    SYMPTOMS TO REPORT IMMEDIATELY: A gastroenterologist can be reached at any hour.  During normal  business hours, 8:30 AM to 5:00 PM Monday through Friday, call [phone].  After hours and on weekends, please call the GI answering service at [phone] who will take a message and have the physician on call contact you.   Following lower endoscopy (colonoscopy or flexible sigmoidoscopy):  Excessive amounts of blood in the stool  Significant tenderness or worsening of abdominal pains  Swelling of the abdomen that is new, acute  Fever of 100F or higher  FOLLOW UP: If any biopsies were taken you will be contacted by phone or by letter within the next 1-3 weeks.  Call your gastroenterologist if you have not heard about the biopsies in 3 weeks.  Our staff will call the home number listed on your records the next business day following your procedure to check on you and address any questions or concerns that you may have at that time regarding the information given to you following your procedure. This is a courtesy call and so if there is no answer at the home number and we have not heard from you through the emergency physician on call, we will assume that you have returned to your regular daily activities without incident.  SIGNATURES/CONFIDENTIALITY: You and/or your care partner have signed paperwork which will be entered into your electronic medical record.  These signatures attest to the fact that that the information above on your After Visit Summary has been  reviewed and is understood.  Full responsibility of the confidentiality of this discharge information lies with you and/or your care-partner.  Hold all aspirin and NSAIDS for two weeks.  You may take your baby aspirin, but no other.  We don't want the cauterized areas to bleed.  Read all of the handouts given to you by your recovery room nurse.

## 2014-07-12 ENCOUNTER — Telehealth: Payer: Self-pay

## 2014-07-12 NOTE — Telephone Encounter (Signed)
  Follow up Call-  Call back number 07/09/2014  Post procedure Call Back phone  # [phone] hm  Permission to leave phone message Yes     Patient questions:  Do you have a fever, pain , or abdominal swelling? No. Pain Score  0 *  Have you tolerated food without any problems? Yes.    Have you been able to return to your normal activities? Yes.    Do you have any questions about your discharge instructions: Diet   No. Medications  No. Follow up visit  No.  Do you have questions or concerns about your Care? No.  Actions: * If pain score is 4 or above: No action needed, pain <4.

## 2014-07-19 ENCOUNTER — Encounter: Payer: Self-pay | Admitting: Gastroenterology

## 2014-08-02 ENCOUNTER — Other Ambulatory Visit: Payer: Self-pay | Admitting: Internal Medicine

## 2014-09-01 ENCOUNTER — Telehealth: Payer: Self-pay | Admitting: Internal Medicine

## 2014-09-01 MED ORDER — FLUTICASONE PROPIONATE 50 MCG/ACT NA SUSP: 2.0000 | Freq: Every day | NASAL | Status: DC

## 2014-09-01 NOTE — Telephone Encounter (Signed)
Patient is requesting a different nasal spray script in place of nasacort [facility]

## 2014-09-01 NOTE — Telephone Encounter (Signed)
flonase done erx 

## 2015-04-12 ENCOUNTER — Telehealth: Payer: Self-pay

## 2015-04-12 NOTE — Telephone Encounter (Signed)
Spoke to Brian Branch regarding AWV; Stated that wife had apt the same day at [place] following his 8:30 apt. Wife was originally scheduled as fup but had 30 minute time slot; apt changed to CPE and the plan is for them to arrive at 8:15 and I will see Brian Branch for AWV prior to her apt at 9am. Per Brian Branch, they have always had separate apt and do not see the doctor together.

## 2015-04-22 ENCOUNTER — Other Ambulatory Visit (INDEPENDENT_AMBULATORY_CARE_PROVIDER_SITE_OTHER): Payer: Medicare Other

## 2015-04-22 ENCOUNTER — Ambulatory Visit (INDEPENDENT_AMBULATORY_CARE_PROVIDER_SITE_OTHER): Payer: Medicare Other | Admitting: Internal Medicine

## 2015-04-22 ENCOUNTER — Encounter: Payer: Self-pay | Admitting: Internal Medicine

## 2015-04-22 VITALS — BP 134/82 | HR 55 | Temp 98.0°F | Ht 66.0 in | Wt 170.0 lb

## 2015-04-22 DIAGNOSIS — E785 Hyperlipidemia, unspecified: Secondary | ICD-10-CM | POA: Diagnosis not present

## 2015-04-22 DIAGNOSIS — Z23 Encounter for immunization: Secondary | ICD-10-CM | POA: Diagnosis not present

## 2015-04-22 DIAGNOSIS — N320 Bladder-neck obstruction: Secondary | ICD-10-CM

## 2015-04-22 DIAGNOSIS — F329 Major depressive disorder, single episode, unspecified: Secondary | ICD-10-CM

## 2015-04-22 DIAGNOSIS — Z0000 Encounter for general adult medical examination without abnormal findings: Secondary | ICD-10-CM

## 2015-04-22 DIAGNOSIS — R490 Dysphonia: Secondary | ICD-10-CM

## 2015-04-22 DIAGNOSIS — J309 Allergic rhinitis, unspecified: Secondary | ICD-10-CM

## 2015-04-22 DIAGNOSIS — F32A Depression, unspecified: Secondary | ICD-10-CM

## 2015-04-22 LAB — LIPID PANEL
CHOLESTEROL: 114 mg/dL (ref 0–200)
HDL: 33.1 mg/dL — AB (ref >39.00)
LDL Cholesterol: 54 mg/dL (ref 0–99)
NonHDL: 81.28
TRIGLYCERIDES: 135 mg/dL (ref 0.0–149.0)
Total CHOL/HDL Ratio: 3
VLDL: 27 mg/dL (ref 0.0–40.0)

## 2015-04-22 LAB — URINALYSIS, ROUTINE W REFLEX MICROSCOPIC
BILIRUBIN URINE: NEGATIVE
Hgb urine dipstick: NEGATIVE
KETONES UR: NEGATIVE
LEUKOCYTES UA: NEGATIVE
Nitrite: NEGATIVE
PH: 5.5 (ref 5.0–8.0)
RBC / HPF: NONE SEEN (ref 0–?)
Specific Gravity, Urine: 1.025 (ref 1.000–1.030)
TOTAL PROTEIN, URINE-UPE24: NEGATIVE
URINE GLUCOSE: NEGATIVE
UROBILINOGEN UA: 0.2 (ref 0.0–1.0)
WBC, UA: NONE SEEN (ref 0–?)

## 2015-04-22 LAB — HEPATIC FUNCTION PANEL
ALBUMIN: 4.4 g/dL (ref 3.5–5.2)
ALK PHOS: 43 U/L (ref 39–117)
ALT: 27 U/L (ref 0–53)
AST: 24 U/L (ref 0–37)
Bilirubin, Direct: 0.2 mg/dL (ref 0.0–0.3)
TOTAL PROTEIN: 7.5 g/dL (ref 6.0–8.3)
Total Bilirubin: 0.8 mg/dL (ref 0.2–1.2)

## 2015-04-22 LAB — CBC WITH DIFFERENTIAL/PLATELET
Basophils Absolute: 0 10*3/uL (ref 0.0–0.1)
Basophils Relative: 0.5 % (ref 0.0–3.0)
Eosinophils Absolute: 0.5 10*3/uL (ref 0.0–0.7)
Eosinophils Relative: 6.1 % — ABNORMAL HIGH (ref 0.0–5.0)
HCT: 45.6 % (ref 39.0–52.0)
Hemoglobin: 15.1 g/dL (ref 13.0–17.0)
LYMPHS ABS: 2.3 10*3/uL (ref 0.7–4.0)
Lymphocytes Relative: 26.1 % (ref 12.0–46.0)
MCHC: 33.2 g/dL (ref 30.0–36.0)
MCV: 93.1 fl (ref 78.0–100.0)
MONO ABS: 0.7 10*3/uL (ref 0.1–1.0)
MONOS PCT: 7.9 % (ref 3.0–12.0)
NEUTROS PCT: 59.4 % (ref 43.0–77.0)
Neutro Abs: 5.3 10*3/uL (ref 1.4–7.7)
Platelets: 231 10*3/uL (ref 150.0–400.0)
RBC: 4.9 Mil/uL (ref 4.22–5.81)
RDW: 13.1 % (ref 11.5–15.5)
WBC: 8.9 10*3/uL (ref 4.0–10.5)

## 2015-04-22 LAB — BASIC METABOLIC PANEL
BUN: 17 mg/dL (ref 6–23)
CALCIUM: 9.3 mg/dL (ref 8.4–10.5)
CO2: 24 meq/L (ref 19–32)
Chloride: 105 mEq/L (ref 96–112)
Creatinine, Ser: 1.04 mg/dL (ref 0.40–1.50)
GFR: 74.36 mL/min (ref >60.00)
GLUCOSE: 102 mg/dL — AB (ref 70–99)
Potassium: 4 mEq/L (ref 3.5–5.1)
SODIUM: 139 meq/L (ref 135–145)

## 2015-04-22 LAB — TSH: TSH: 1.65 u[IU]/mL (ref 0.35–4.50)

## 2015-04-22 LAB — PSA: PSA: 1.4 ng/mL (ref 0.10–4.00)

## 2015-04-22 MED ORDER — FLUTICASONE PROPIONATE 50 MCG/ACT NA SUSP: 2.0000 | Freq: Every day | NASAL | Status: DC

## 2015-04-22 MED ORDER — NAPROXEN SODIUM 220 MG PO TABS: 440.0000 mg | ORAL_TABLET | Freq: Two times a day (BID) | ORAL | Status: DC

## 2015-04-22 MED ORDER — ROSUVASTATIN CALCIUM 20 MG PO TABS: 20.0000 mg | ORAL_TABLET | Freq: Every day | ORAL | Status: DC

## 2015-04-22 NOTE — Progress Notes (Signed)
Subjective:    Patient ID: Brian Branch, male    DOB: May 30, 1941, 74 y.o.   MRN: [id number]  HPI  Here for yearly f/u;  Overall doing ok;  Pt denies Chest pain, worsening SOB, DOE, wheezing, orthopnea, PND, worsening LE edema, palpitations, dizziness or syncope.  Pt denies neurological change such as new headache, facial or extremity weakness.  Pt denies polydipsia, polyuria, or low sugar symptoms. Pt states overall good compliance with treatment and medications, good tolerability, and has been trying to follow appropriate diet.  Pt denies worsening depressive symptoms, suicidal ideation or panic. No fever, night sweats, wt loss, loss of appetite, or other constitutional symptoms.  Pt states good ability with ADL's, has low fall risk, home safety reviewed and adequate, no other significant changes in hearing or vision, and only occasionally active with exercise due to ongoing right hip arthritic pain, difficult to even walk the dog.  Does have several wks ongoing nasal allergy symptoms with clearish congestion, itch and sneezing, without fever, pain, ST, cough, swelling or wheezing, but improved with current meds.  Does have 1-2 yrs gradually worsening hoarseness now to the point cannot be ignored, not clear is from post nasal gtt which he thinks is usually well controlled.  No fever, cough.  Has been smoker Past Medical History  Diagnosis Date  . ALLERGIC RHINITIS 02/15/2007  . BENIGN PROSTATIC HYPERTROPHY 02/15/2007  . COLONIC POLYPS, HX OF 08/29/2007  . CORONARY ARTERY DISEASE 02/15/2007  . 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 FATIGUE 08/29/2007  . HYPERLIPIDEMIA 02/15/2007  . INSOMNIA-SLEEP DISORDER-UNSPEC 03/09/2010  . KNEE PAIN, LEFT 08/29/2007  . 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 SHINGLES, 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  . TOBACCO ABUSE 03/09/2010  . Unspecified eustachian tube disorder 03/09/2010  . CAD (coronary artery disease)   . ED (erectile dysfunction)   . DEPRESSION 08/29/2007     pt denies having depression   Past Surgical History  Procedure Laterality Date  . Appendectomy  1951  . Coronary artery bypass graft  1991  . Tonsillectomy and adenoidectomy      reports that he quit smoking about 6 years ago. He has never used smokeless tobacco. He reports that he drinks about 1.2 oz of alcohol per week. He reports that he does not use illicit drugs. family history includes Arthritis in his mother; Heart disease in his father. There is no history of Colon cancer, Esophageal cancer, Rectal cancer, Stomach cancer, Pancreatic cancer, or Prostate cancer. Allergies  Allergen Reactions  . Levitra [Vardenafil Hydrochloride]     headache  . Lincomycin Hives  . Penicillins     REACTION: Hives  . Tadalafil     REACTION: headache  . Triazolam     REACTION: hallucinations   Current Outpatient Prescriptions on File Prior to Visit  Medication Sig Dispense Refill  . acetaminophen (TYLENOL) 500 MG tablet Take 500 mg by mouth 3 (three) times daily.    . ASPIRIN LOW DOSE 81 MG EC tablet TAKE 1 TABLET DAILY. SWALLOW WHOLE. 90 tablet 3  . Boswellia-Glucosamine-Vit D (HM GLUCOSAMINE & VITAMIN D3) TABS Take 1 tablet by mouth 2 (two) times daily.    Marland Kitchen triamcinolone cream (KENALOG) 0.1 % Apply topically 2 (two) times daily. As needed 30 g 0   No current facility-administered medications on file prior to visit.    Review of Systems Constitutional: Negative for increased diaphoresis, other activity, appetite or siginficant weight change other than noted HENT: Negative for worsening hearing  loss, ear pain, facial swelling, mouth sores and neck stiffness.   Eyes: Negative for other worsening pain, redness or visual disturbance.  Respiratory: Negative for shortness of breath and wheezing  Cardiovascular: Negative for chest pain and palpitations.  Gastrointestinal: Negative for diarrhea, blood in stool, abdominal distention or other pain Genitourinary: Negative for hematuria, flank  pain or change in urine volume.  Musculoskeletal: Negative for myalgias or other joint complaints.  Skin: Negative for color change and wound or drainage.  Neurological: Negative for syncope and numbness. other than noted Hematological: Negative for adenopathy. or other swelling Psychiatric/Behavioral: Negative for hallucinations, SI, self-injury, decreased concentration or other worsening agitation.      Objective:   Physical Exam BP 134/82 mmHg  Pulse 55  Temp(Src) 98 F (36.7 C) (Oral)  Ht 5\' 6"  (1.676 m)  Wt 170 lb (77.111 kg)  BMI 27.45 kg/m2  SpO2 96% VS noted,  Constitutional: Pt is oriented to person, place, and time. Appears well-developed and well-nourished, in no significant distress Head: Normocephalic and atraumatic.  Right Ear: External ear normal.  Left Ear: External ear normal.  Nose: Nose normal.  Mouth/Throat: Oropharynx is clear and moist.  Eyes: Conjunctivae and EOM are normal. Pupils are equal, round, and reactive to light.  Neck: Normal range of motion. Neck supple. No JVD present. No tracheal deviation present or significant neck LA or mass Cardiovascular: Normal rate, regular rhythm, normal heart sounds and intact distal pulses.   Pulmonary/Chest: Effort normal and breath sounds without rales or wheezing  Abdominal: Soft. Bowel sounds are normal. NT. No HSM  Musculoskeletal: Normal range of motion. Exhibits no edema.  Lymphadenopathy:  Has no cervical adenopathy.  Neurological: Pt is alert and oriented to person, place, and time. Pt has normal reflexes. No cranial nerve deficit. Motor grossly intact, mild hoarseness of voice noted Skin: Skin is warm and dry. No rash noted.  Psychiatric:  Has normal mood and affect. Behavior is normal.     Assessment & Plan:

## 2015-04-22 NOTE — Assessment & Plan Note (Addendum)
stable overall by history and exam, recent data reviewed with pt, and pt to continue medical treatment as before,  to f/u any worsening symptoms or concerns\ Lab Results  Component Value Date   LDLCALC 72 04/16/2014   For labs today /ECG reviewed as per emr

## 2015-04-22 NOTE — Assessment & Plan Note (Signed)
stable overall by history and exam, recent data reviewed with pt, and pt to continue medical treatment as before,  to f/u any worsening symptoms or concerns Lab Results  Component Value Date   WBC 10.2 04/16/2014   HGB 14.8 04/16/2014   HCT 43.5 04/16/2014   PLT 238.0 04/16/2014   GLUCOSE 106* 04/16/2014   CHOL 140 04/16/2014   TRIG 161.0* 04/16/2014   HDL 36.00* 04/16/2014   LDLDIRECT 79.0 04/09/2012   LDLCALC 72 04/16/2014   ALT 25 04/16/2014   AST 27 04/16/2014   NA 135 04/16/2014   K 4.9 04/16/2014   CL 103 04/16/2014   CREATININE 1.1 04/16/2014   BUN 18 04/16/2014   CO2 25 04/16/2014   TSH 1.38 04/16/2014   PSA 1.49 04/16/2014   HGBA1C 6.0 03/08/2009

## 2015-04-22 NOTE — Assessment & Plan Note (Signed)
stable overall by history and exam, recent data reviewed with pt, and pt to continue medical treatment as before,  to f/u any worsening symptoms or concerns, for med refills, also takes zyrtec otc

## 2015-04-22 NOTE — Progress Notes (Signed)
Pre visit review using our clinic review tool, if applicable. No additional management support is needed unless otherwise documented below in the visit note. 

## 2015-04-22 NOTE — Assessment & Plan Note (Signed)
Etiology unclear, for cxr, also ENT referral,  to f/u any worsening symptoms or concerns

## 2015-04-22 NOTE — Addendum Note (Signed)
Addended by: Biagio Borg on: 04/22/2015 01:02 PM   Modules accepted: Miquel Dunn

## 2015-04-22 NOTE — Patient Instructions (Signed)
Please continue all other medications as before, and refills have been done if requested.  Please have the pharmacy call with any other refills you may need.  Please continue your efforts at being more active, low cholesterol diet, and weight control.  You are otherwise up to date with prevention measures today.  Please keep your appointments with your specialists as you may have planned  Please go to the XRAY Department in the Basement (go straight as you get off the elevator) for the x-ray testing  Please go to the LAB in the Basement (turn left off the elevator) for the tests to be done today  You will be contacted by phone if any changes need to be made immediately.  Otherwise, you will receive a letter about your results with an explanation, but please check with MyChart first.  Please remember to sign up for MyChart if you have not done so, as this will be important to you in the future with finding out test results, communicating by private email, and scheduling acute appointments online when needed.  Please return in 1 year for your yearly visit, or sooner if needed 

## 2015-05-11 DIAGNOSIS — J387 Other diseases of larynx: Secondary | ICD-10-CM | POA: Diagnosis not present

## 2015-05-11 DIAGNOSIS — R490 Dysphonia: Secondary | ICD-10-CM | POA: Diagnosis not present

## 2015-07-04 ENCOUNTER — Encounter: Payer: Self-pay | Admitting: Internal Medicine

## 2015-07-04 ENCOUNTER — Ambulatory Visit (INDEPENDENT_AMBULATORY_CARE_PROVIDER_SITE_OTHER): Payer: Medicare Other | Admitting: Internal Medicine

## 2015-07-04 VITALS — BP 138/78 | HR 51 | Temp 98.4°F | Resp 20 | Ht 66.0 in | Wt 174.5 lb

## 2015-07-04 DIAGNOSIS — I493 Ventricular premature depolarization: Secondary | ICD-10-CM | POA: Insufficient documentation

## 2015-07-04 DIAGNOSIS — J209 Acute bronchitis, unspecified: Secondary | ICD-10-CM | POA: Diagnosis not present

## 2015-07-04 DIAGNOSIS — I499 Cardiac arrhythmia, unspecified: Secondary | ICD-10-CM

## 2015-07-04 MED ORDER — AZITHROMYCIN 250 MG PO TABS: ORAL_TABLET | ORAL | Status: DC

## 2015-07-04 MED ORDER — PREDNISONE 10 MG PO TABS: 10.0000 mg | ORAL_TABLET | Freq: Every day | ORAL | Status: DC

## 2015-07-04 NOTE — Progress Notes (Signed)
   Subjective:    Patient ID: Brian Branch, male    DOB: July 22, 1941, 74 y.o.   MRN: [id number]  HPI  He has had a cough for 2 weeks. In the last week it's been loose. Today he has noted green discoloration. He's had associated shortness of breath and wheezing. He's had no other upper or lower respiratory tract infection symptoms. He has started taking Robitussin-DM.   Review of Systems Frontal headache, facial pain , nasal purulence, dental pain, sore throat , otic pain or otic discharge denied. No fever , chills or sweats. Extrinsic symptoms of itchy, watery eyes, sneezing, or angioedema are denied. Chest pain, palpitations, tachycardia, exertional dyspnea, paroxysmal nocturnal dyspnea, claudication or edema are absent.      Objective:   Physical Exam He has a full beard and mustache. Dental hygiene is fair; he has scattered plaque and multiple missing teeth. Heart rhythm is irregular.  General appearance:Adequately nourished; no acute distress or increased work of breathing is present.    Lymphatic: No  lymphadenopathy about the head, neck, or axilla .  Eyes: No conjunctival inflammation or lid edema is present. There is no scleral icterus.  Ears:  External ear exam shows no significant lesions or deformities.  Otoscopic examination reveals clear canals, tympanic membranes are intact bilaterally without bulging, retraction, inflammation or discharge.  Nose:  External nasal examination shows no deformity or inflammation. Nasal mucosa are pink and moist without lesions or exudates No septal dislocation or deviation.No obstruction to airflow.   Oral exam:  lips and gums are healthy appearing.There is no oropharyngeal erythema or exudate .  Neck:  No deformities, thyromegaly, masses, or tenderness noted.   Supple with full range of motion without pain.   Heart:   S1 and S2 normal without gallop, murmur, click, or rub   Lungs:Chest clear to auscultation; no wheezes,  rhonchi,rales ,or rubs present.  Extremities:  No cyanosis, edema, or clubbing  noted    Skin: Warm & dry w/o tenting or jaundice. No significant lesions or rash.      Assessment & Plan:  #1 acute bronchitis  #2 dysrhythmia; rule out atrial fib. EKG in September revealed normal sinus rhythm.  EKG reveals unifocal PVCs;cardiology referral

## 2015-07-04 NOTE — Patient Instructions (Addendum)
Carry room temperature water and sip liberally after coughing. Avoid stimulants :decongestants, diet pills, nicotine, caffeine( coffee, tea,cola, chocolate) to excess to prevent  Palpitations. The cardiology referral will be scheduled and you'll be notified of the time.Please call the Referral Co-Ordinator @ [phone] if you have not been notified of appointment time within 7-10 days.

## 2015-07-04 NOTE — Progress Notes (Signed)
Pre visit review using our clinic review tool, if applicable. No additional management support is needed unless otherwise documented below in the visit note. 

## 2015-09-07 ENCOUNTER — Encounter: Payer: Self-pay | Admitting: Podiatry

## 2015-09-07 ENCOUNTER — Ambulatory Visit (INDEPENDENT_AMBULATORY_CARE_PROVIDER_SITE_OTHER): Payer: Medicare Other | Admitting: Podiatry

## 2015-09-07 VITALS — BP 142/75 | HR 62 | Resp 16 | Ht 66.0 in | Wt 175.0 lb

## 2015-09-07 DIAGNOSIS — L84 Corns and callosities: Secondary | ICD-10-CM

## 2015-09-07 DIAGNOSIS — L600 Ingrowing nail: Secondary | ICD-10-CM

## 2015-09-07 NOTE — Progress Notes (Signed)
   Subjective:    Patient ID: Brian Branch, male    DOB: 03/24/1941, 75 y.o.   MRN: [id number]  HPI Patient presents with a nail problem in their Right foot; great toe-medial side. Pt stated, "wants to have checked to see if have an ingrown toenail".  Patient also presents with a painful lesion in their left foot; plantar forefoot (below 2nd toe).  Review of Systems  All other systems reviewed and are negative.      Objective:   Physical Exam        Assessment & Plan:

## 2015-09-07 NOTE — Patient Instructions (Signed)

## 2015-09-07 NOTE — Progress Notes (Signed)
Subjective:     Patient ID: Brian Branch, male   DOB: [birth year], 75 y.o.   MRN: [id number]  HPI patient presents with ingrown toenail deformity right big toe that he has tried to trim and soak without relief and also small lesion on the bottom of his left foot.   Review of Systems  All other systems reviewed and are negative.      Objective:   Physical Exam  Constitutional: He is oriented to person, place, and time.  Cardiovascular: Intact distal pulses.   Musculoskeletal: Normal range of motion.  Neurological: He is oriented to person, place, and time.  Skin: Skin is warm.  Nursing note and vitals reviewed.  neurovascular status intact muscle strength adequate range of motion within normal limits with patient found to have incurvated medial border right hallux that's painful when pressed and small keratotic lesion plantar aspect left foot that's mildly painful. Patient's found to have good digital perfusion and is well oriented 3     Assessment:     Ingrown toenail deformity right hallux medial border and small porokeratotic lesion plantar left    Plan:     H&P and both conditions discussed reviewed. I debrided the lesion left and I recommended correction of the ingrown toenail explaining risk

## 2015-09-13 ENCOUNTER — Telehealth: Payer: Self-pay | Admitting: *Deleted

## 2015-09-13 NOTE — Telephone Encounter (Signed)
Called patient at [phone] (Home #) to check to see how they were doing from their ingrown toenail procedure that was performed on Wednesday, September 07, 2015. Pt stated, "Toe is does very well. Still soaking toe twice a day. Pt has had no pain".

## 2015-10-20 ENCOUNTER — Encounter: Payer: Self-pay | Admitting: Family Medicine

## 2015-10-20 ENCOUNTER — Ambulatory Visit (INDEPENDENT_AMBULATORY_CARE_PROVIDER_SITE_OTHER): Payer: Medicare Other | Admitting: Family Medicine

## 2015-10-20 ENCOUNTER — Ambulatory Visit (INDEPENDENT_AMBULATORY_CARE_PROVIDER_SITE_OTHER)
Admission: RE | Admit: 2015-10-20 | Discharge: 2015-10-20 | Disposition: A | Discharge status: Home / Self Care | Payer: Medicare Other | Source: Ambulatory Visit | Attending: Family Medicine | Admitting: Family Medicine

## 2015-10-20 VITALS — BP 122/70 | HR 67 | Ht 66.0 in | Wt 175.0 lb

## 2015-10-20 DIAGNOSIS — M545 Low back pain, unspecified: Secondary | ICD-10-CM | POA: Insufficient documentation

## 2015-10-20 MED ORDER — TIZANIDINE HCL 4 MG PO TABS: 4.0000 mg | ORAL_TABLET | Freq: Every evening | ORAL | Status: DC

## 2015-10-20 NOTE — Assessment & Plan Note (Signed)
Patient's low back pain is without sciatica. Patient denies any type of history of injury. 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 Likely arthritis as playing some role. I do think that some of it is muscular. Patient will continue with over-the-counter medications as we've discussed previously and patient given a muscle relaxer to take at night if necessary. We discussed icing regimen. Patient will try to make these different changes and come back and see me again in 2-3 weeks. X-rays are pending for further evaluation. Patient continues to have trouble he can be a candidate for formal physical therapy or if any radicular symptoms or weakness of the lower extremities occur advance imaging would be warranted.

## 2015-10-20 NOTE — Progress Notes (Signed)
Pre visit review using our clinic review tool, if applicable. No additional management support is needed unless otherwise documented below in the visit note. 

## 2015-10-20 NOTE — Progress Notes (Signed)
[facility] [address], [place] [address] Phone: [phone] Subjective:      CC: Low back pain  [id number] KNIGHTLY MARKELL is a 75 y.o. male coming in with complaint of patient is coming in with low back pain. Has had intermittent for decades. Patient states though that usually goes away after 5 days and this time it has not. States that it is more of a soreness. An states that he has a severe pain seems localized at the very bottom of his back. Seems to be closer to midline. Denies any radiation down the leg or any weakness. Denies any fevers, chills, any abnormal weight loss. Rates the severity of pain a 6 out of 10. Has tried some mild over-the-counter anti-inflammatories with mild improvement. Does not remember any true injury. This prevented started and seems to be more intermittent over the course last 2 months.     Past Medical History  Diagnosis Date  . ALLERGIC RHINITIS 02/15/2007  . BENIGN PROSTATIC HYPERTROPHY 02/15/2007  . COLONIC POLYPS, HX OF 08/29/2007  . CORONARY ARTERY DISEASE 02/15/2007  . 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 FATIGUE 08/29/2007  . HYPERLIPIDEMIA 02/15/2007  . INSOMNIA-SLEEP DISORDER-UNSPEC 03/09/2010  . KNEE PAIN, LEFT 08/29/2007  . 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 SHINGLES, 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  . TOBACCO ABUSE 03/09/2010  . Unspecified eustachian tube disorder 03/09/2010  . CAD (coronary artery disease)   . ED (erectile dysfunction)   . DEPRESSION 08/29/2007    pt denies having depression  . Heart attack ([place])     [place]   Past Surgical History  Procedure Laterality Date  . Appendectomy  1951  . Coronary artery bypass graft  1991  . Tonsillectomy and adenoidectomy     Social History   Social History  . Marital Status: Married    Spouse Name: N/A  . Number of Children: N/A  . Years of Education: N/A   Social History Main Topics  . Smoking status: Former Smoker    Quit  date: 07/23/2008  . Smokeless tobacco: Never Used  . Alcohol Use: 1.2 oz/week    2 Cans of beer per week  . Drug Use: No  . Sexual Activity: Not Asked   Other Topics Concern  . None   Social History Narrative   Allergies  Allergen Reactions  . Levitra [Vardenafil Hydrochloride]     headache  . Lincomycin Hives  . Penicillins     REACTION: Hives  . Tadalafil     REACTION: headache  . Triazolam     REACTION: hallucinations   Family History  Problem Relation Age of Onset  . Arthritis Mother     Rhuematoid  . Heart disease Father   . Colon cancer Neg Hx   . Esophageal cancer Neg Hx   . Rectal cancer Neg Hx   . Stomach cancer Neg Hx   . Pancreatic cancer Neg Hx   . Prostate cancer Neg Hx     Past medical history, social, surgical and family history all reviewed in electronic medical record.  No pertanent information unless stated regarding to the chief complaint.   Review of Systems: No headache, visual changes, nausea, vomiting, diarrhea, constipation, dizziness, abdominal pain, skin rash, fevers, chills, night sweats, weight loss, swollen lymph nodes, body aches, joint swelling, muscle aches, chest pain, shortness of breath, mood changes.   Objective Blood pressure 122/70, pulse 67, height 5\' 6"  (1.676 m), weight  175 lb (79.379 kg), SpO2 98 %.  General: No apparent distress alert and oriented x3 mood and affect normal, dressed appropriately.  HEENT: Pupils equal, extraocular movements intact  Respiratory: Patient's speak in full sentences and does not appear short of breath  Cardiovascular: No lower extremity edema, non tender, no erythema  Skin: Warm dry intact with no signs of infection or rash on extremities or on axial skeleton.  Abdomen: Soft nontender  Neuro: Cranial nerves II through XII are intact, neurovascularly intact in all extremities with 2+ DTRs and 2+ pulses.  Lymph: No lymphadenopathy of posterior or anterior cervical chain or axillae bilaterally.    Gait normal with good balance and coordination.  MSK:  Non tender with good stability and symmetric strength and tone of shoulders, elbows, wrist, hip, knee and ankles bilaterally. Arthritic changes of multiple joints with patient having very minimal internal rotation of the right hip.  Back Exam:  Inspection: Unremarkable  Motion: Flexion 35 deg, Extension 15 deg, Side Bending to 35 deg bilaterally,  Rotation to 35 deg bilaterally  SLR laying: Negative  XSLR laying: Negative  Palpable tenderness: Tender over the sacroiliac joints bilaterally FABER: negative. Sensory change: Gross sensation intact to all lumbar and sacral dermatomes.  Reflexes: 2+ at both patellar tendons, 2+ at achilles tendons, Babinski's downgoing.  Strength at foot  Plantar-flexion: 5/5 Dorsi-flexion: 5/5 Eversion: 5/5 Inversion: 5/5  Leg strength  Quad: 5/5 Hamstring: 5/5 Hip flexor: 5/5 Hip abductors: 3+/5  Gait unremarkable.  Procedure note [id number]; 15 minutes spent for Therapeutic exercises as stated in above notes.  This included exercises focusing on stretching, strengthening, with significant focus on eccentric aspects. Pelvic tilt/bracing instruction to focus on control of the pelvic girdle and lower abdominal muscles  Glute strengthening exercises, focusing on proper firing of the glutes without engaging the low back muscles Proper stretching techniques for maximum relief for the hamstrings, hip flexors, low back and some rotation where tolerated  Proper technique shown and discussed handout in great detail with ATC.  All questions were discussed and answered.     Impression and Recommendations:     This case required medical decision making of moderate complexity.      Note: This dictation was prepared with Dragon dictation along with smaller phrase technology. Any transcriptional errors that result from this process are unintentional.

## 2015-10-20 NOTE — Patient Instructions (Signed)
Good to see you  Ice 20 minutes 2 times daily. Usually after activity and before bed. Vitamin D 2000 IU daily  Iron 65mg  daily with 500mg  of vitamin C will help Continue the naproxen Zanaflex at night if need Exercises 3 times a week.  Xray downstairs  See me again in 2-3 weeks to make sure you are dong well.

## 2015-10-21 ENCOUNTER — Encounter: Payer: Self-pay | Admitting: Family Medicine

## 2015-11-07 ENCOUNTER — Ambulatory Visit (INDEPENDENT_AMBULATORY_CARE_PROVIDER_SITE_OTHER): Payer: Medicare Other | Admitting: Family Medicine

## 2015-11-07 ENCOUNTER — Encounter: Payer: Self-pay | Admitting: Family Medicine

## 2015-11-07 VITALS — BP 118/74 | HR 76 | Wt 170.0 lb

## 2015-11-07 DIAGNOSIS — M47816 Spondylosis without myelopathy or radiculopathy, lumbar region: Secondary | ICD-10-CM | POA: Diagnosis not present

## 2015-11-07 NOTE — Assessment & Plan Note (Signed)
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 We discussed icing regimen and home exercises. Discussed which activities to do in which ones to avoid. Patient will come back and see me again in 8 weeks. Does have muscle relaxer for breakthrough pain.

## 2015-11-07 NOTE — Progress Notes (Signed)
[facility] [address], [place] [address] Phone: [phone] Subjective:      CC: Low back pain f/u   [id number] Brian Branch is a 75 y.o. male coming in with complaint of patient is coming in with low back pain. Patient was seen previously and did have x-rays that were independently visualized by me. Patient's x-rays show that he does have severe degenerative disc disease at multiple levels of the lumbar spine with foraminal narrowing. Patient though has been doing exercises, started doing the over-the-counter vitamin supplementations and being more active. States that he does feel a proximal wing 90% better. Not as much severe pain or tightness as he was having previously. No radiation down the legs.     Past Medical History  Diagnosis Date  . ALLERGIC RHINITIS 02/15/2007  . BENIGN PROSTATIC HYPERTROPHY 02/15/2007  . COLONIC POLYPS, HX OF 08/29/2007  . CORONARY ARTERY DISEASE 02/15/2007  . 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 FATIGUE 08/29/2007  . HYPERLIPIDEMIA 02/15/2007  . INSOMNIA-SLEEP DISORDER-UNSPEC 03/09/2010  . KNEE PAIN, LEFT 08/29/2007  . 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 SHINGLES, 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  . TOBACCO ABUSE 03/09/2010  . Unspecified eustachian tube disorder 03/09/2010  . CAD (coronary artery disease)   . ED (erectile dysfunction)   . DEPRESSION 08/29/2007    pt denies having depression  . Heart attack ([place]     [place]   Past Surgical History  Procedure Laterality Date  . Appendectomy  1951  . Coronary artery bypass graft  1991  . Tonsillectomy and adenoidectomy     Social History   Social History  . Marital Status: Married    Spouse Name: N/A  . Number of Children: N/A  . Years of Education: N/A   Social History Main Topics  . Smoking status: Former Smoker    Quit date: 07/23/2008  . Smokeless tobacco: Never Used  . Alcohol Use: 1.2 oz/week    2 Cans of beer per week  . Drug  Use: No  . Sexual Activity: Not Asked   Other Topics Concern  . None   Social History Narrative   Allergies  Allergen Reactions  . Levitra [Vardenafil Hydrochloride]     headache  . Lincomycin Hives  . Penicillins     REACTION: Hives  . Tadalafil     REACTION: headache  . Triazolam     REACTION: hallucinations   Family History  Problem Relation Age of Onset  . Arthritis Mother     Rhuematoid  . Heart disease Father   . Colon cancer Neg Hx   . Esophageal cancer Neg Hx   . Rectal cancer Neg Hx   . Stomach cancer Neg Hx   . Pancreatic cancer Neg Hx   . Prostate cancer Neg Hx     Past medical history, social, surgical and family history all reviewed in electronic medical record.  No pertanent information unless stated regarding to the chief complaint.   Review of Systems: No headache, visual changes, nausea, vomiting, diarrhea, constipation, dizziness, abdominal pain, skin rash, fevers, chills, night sweats, weight loss, swollen lymph nodes, body aches, joint swelling, muscle aches, chest pain, shortness of breath, mood changes.   Objective Blood pressure 118/74, pulse 76, weight 170 lb (77.111 kg).  General: No apparent distress alert and oriented x3 mood and affect normal, dressed appropriately.  HEENT: Pupils equal, extraocular movements intact  Respiratory: Patient's speak in full sentences and  does not appear short of breath  Cardiovascular: No lower extremity edema, non tender, no erythema  Skin: Warm dry intact with no signs of infection or rash on extremities or on axial skeleton.  Abdomen: Soft nontender  Neuro: Cranial nerves II through XII are intact, neurovascularly intact in all extremities with 2+ DTRs and 2+ pulses.  Lymph: No lymphadenopathy of posterior or anterior cervical chain or axillae bilaterally.  Gait normal with good balance and coordination.  MSK:  Non tender with good stability and symmetric strength and tone of shoulders, elbows, wrist, hip,  knee and ankles bilaterally. Arthritic changes of multiple joints with patient having very minimal internal rotation of the right hip.  Back Exam:  Inspection: Unremarkable  Motion: Flexion 35 deg, Extension 20 deg, Side Bending to 35 deg bilaterally,  Rotation to 35 deg bilaterally  SLR laying: Negative  XSLR laying: Negative  Palpable tenderness:  Mild tenderness over the sacroiliac joints again but improved FABER: negative. Sensory change: Gross sensation intact to all lumbar and sacral dermatomes.  Reflexes: 2+ at both patellar tendons, 2+ at achilles tendons, Babinski's downgoing.  Strength at foot  Plantar-flexion: 5/5 Dorsi-flexion: 5/5 Eversion: 5/5 Inversion: 5/5  Leg strength  Quad: 5/5 Hamstring: 5/5 Hip flexor: 5/5 Hip abductors: 4+/5  Gait unremarkable.     Impression and Recommendations:     This case required medical decision making of moderate complexity.      Note: This dictation was prepared with Dragon dictation along with smaller phrase technology. Any transcriptional errors that result from this process are unintentional.

## 2015-11-07 NOTE — Patient Instructions (Signed)
Good to see you Ice 20 minutes 2 times daily. Usually after activity and before bed. Ty;lenol 500mg  3 times a day can help Vitamin D and D3 are the same.  Continue the exercises 2-3 times a week See me when you need me.

## 2015-11-16 ENCOUNTER — Ambulatory Visit: Payer: Medicare Other | Admitting: Internal Medicine

## 2015-11-17 ENCOUNTER — Ambulatory Visit (INDEPENDENT_AMBULATORY_CARE_PROVIDER_SITE_OTHER): Payer: Medicare Other | Admitting: Internal Medicine

## 2015-11-17 ENCOUNTER — Encounter: Payer: Self-pay | Admitting: Internal Medicine

## 2015-11-17 VITALS — BP 130/78 | HR 74 | Temp 98.7°F | Resp 20 | Wt 167.0 lb

## 2015-11-17 DIAGNOSIS — R509 Fever, unspecified: Secondary | ICD-10-CM

## 2015-11-17 MED ORDER — LEVOFLOXACIN 250 MG PO TABS: 250.0000 mg | ORAL_TABLET | Freq: Every day | ORAL | Status: DC

## 2015-11-17 NOTE — Patient Instructions (Signed)
Please take all new medication as prescribed - the levaquin (after the cultures are done would be ideal)  Please continue all other medications as before, and refills have been done if requested.  Please have the pharmacy call with any other refills you may need.  Please keep your appointments with your specialists as you may have planned  Please go to the XRAY Department in the Basement (go straight as you get off the elevator) for the x-ray testing tomorrow  Please go to the LAB in the Basement (turn left off the elevator) for the tests to be done tomorrow  You will be contacted by phone if any changes need to be made immediately.  Otherwise, you will receive a letter about your results with an explanation, but please check with MyChart first.  Please remember to sign up for MyChart if you have not done so, as this will be important to you in the future with finding out test results, communicating by private email, and scheduling acute appointments online when needed.

## 2015-11-17 NOTE — Progress Notes (Signed)
Pre visit review using our clinic review tool, if applicable. No additional management support is needed unless otherwise documented below in the visit note. 

## 2015-11-17 NOTE — Progress Notes (Signed)
Subjective:    Patient ID: Brian Branch, male    DOB: 1941/06/09, 75 y.o.   MRN: [id number]  HPI  Here with c/o feeling warm/feverish, general weakness , fatigue night sweats and freezing where he would have to change the bedclothes twice per night.  No HA, ST, cough, and Pt denies chest pain, increased sob or doe, wheezing, orthopnea, PND, increased LE swelling, palpitations, dizziness or syncope.  Has hx of UTI.  Denies urinary symptoms such as dysuria, frequency, urgency, flank pain, hematuria or n/v.  Pt denies new neurological symptoms such as new headache, or facial or extremity weakness or numbness   Pt denies polydipsia, polyuria Denies worsening reflux, abd pain, dysphagia, n/v, bowel change or blood. Past Medical History  Diagnosis Date  . ALLERGIC RHINITIS 02/15/2007  . BENIGN PROSTATIC HYPERTROPHY 02/15/2007  . COLONIC POLYPS, HX OF 08/29/2007  . CORONARY ARTERY DISEASE 02/15/2007  . 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 FATIGUE 08/29/2007  . HYPERLIPIDEMIA 02/15/2007  . INSOMNIA-SLEEP DISORDER-UNSPEC 03/09/2010  . KNEE PAIN, LEFT 08/29/2007  . 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 SHINGLES, 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  . TOBACCO ABUSE 03/09/2010  . Unspecified eustachian tube disorder 03/09/2010  . CAD (coronary artery disease)   . ED (erectile dysfunction)   . DEPRESSION 08/29/2007    pt denies having depression  . Heart attack ([place])     [place]   Past Surgical History  Procedure Laterality Date  . Appendectomy  1951  . Coronary artery bypass graft  1991  . Tonsillectomy and adenoidectomy      reports that he quit smoking about 7 years ago. He has never used smokeless tobacco. He reports that he drinks about 1.2 oz of alcohol per week. He reports that he does not use illicit drugs. family history includes Arthritis in his mother; Heart disease in his father. There is no history of Colon cancer, Esophageal cancer, Rectal cancer, Stomach cancer, Pancreatic cancer,  or Prostate cancer. Allergies  Allergen Reactions  . Levitra [Vardenafil Hydrochloride]     headache  . Lincomycin Hives  . Penicillins     REACTION: Hives  . Tadalafil     REACTION: headache  . Triazolam     REACTION: hallucinations   Current Outpatient Prescriptions on File Prior to Visit  Medication Sig Dispense Refill  . ASPIRIN LOW DOSE 81 MG EC tablet TAKE 1 TABLET DAILY. SWALLOW WHOLE. 90 tablet 3  . [place]-Glucosamine-Vit D (HM GLUCOSAMINE & VITAMIN D3) TABS Take 1 tablet by mouth 2 (two) times daily.    . fluticasone (FLONASE) 50 MCG/ACT nasal spray Place 2 sprays into both nostrils daily. (Patient taking differently: Place 2 sprays into both nostrils as needed. ) 48 g 3  . omeprazole (PRILOSEC) 40 MG capsule Take 40 mg by mouth daily.    . rosuvastatin (CRESTOR) 20 MG tablet Take 1 tablet (20 mg total) by mouth daily. 90 tablet 3  . tiZANidine (ZANAFLEX) 4 MG tablet Take 1 tablet (4 mg total) by mouth Nightly. 30 tablet 2   No current facility-administered medications on file prior to visit.   Review of Systems  Constitutional: Negative for unusual diaphoresis or night sweats HENT: Negative for ear swelling or discharge Eyes: Negative for worsening visual haziness  Respiratory: Negative for choking and stridor.   Gastrointestinal: Negative for distension or worsening eructation Genitourinary: Negative for retention or change in urine volume.  Musculoskeletal: Negative for other MSK pain or swelling Skin: Negative for  color change and worsening wound Neurological: Negative for tremors and numbness other than noted  Psychiatric/Behavioral: Negative for decreased concentration or agitation other than above       Objective:   Physical Exam BP 130/78 mmHg  Pulse 74  Temp(Src) 98.7 F (37.1 C) (Oral)  Resp 20  Wt 167 lb (75.751 kg)  SpO2 96% VS noted, fatigued, mild ill appaering Constitutional: Pt appears in no apparent distress HENT: Head: NCAT.  Right Ear:  External ear normal.  Left Ear: External ear normal.  Bilat tm's with mild erythema.  Max sinus areas non tender.  Pharynx with mild erythema, no exudate Eyes: . Pupils are equal, round, and reactive to light. Conjunctivae and EOM are normal Neck: Normal range of motion. Neck supple.  Cardiovascular: Normal rate and regular rhythm.   Pulmonary/Chest: Effort normal and breath sounds without rales or wheezing.  Abd:  Soft, NT, ND, + BS, no flank tender Neurological: Pt is alert. Not confused , motor grossly intact Skin: Skin is warm. No rash, no LE edema Psychiatric: Pt behavior is normal. No agitation.     Assessment & Plan:

## 2015-11-18 ENCOUNTER — Other Ambulatory Visit (INDEPENDENT_AMBULATORY_CARE_PROVIDER_SITE_OTHER): Payer: Medicare Other

## 2015-11-18 ENCOUNTER — Ambulatory Visit (INDEPENDENT_AMBULATORY_CARE_PROVIDER_SITE_OTHER)
Admission: RE | Admit: 2015-11-18 | Discharge: 2015-11-18 | Disposition: A | Discharge status: Home / Self Care | Payer: Medicare Other | Source: Ambulatory Visit | Attending: Internal Medicine | Admitting: Internal Medicine

## 2015-11-18 DIAGNOSIS — R509 Fever, unspecified: Secondary | ICD-10-CM

## 2015-11-18 LAB — C-REACTIVE PROTEIN: CRP: 1 mg/dL (ref 0.5–20.0)

## 2015-11-18 LAB — HEPATIC FUNCTION PANEL
ALK PHOS: 47 U/L (ref 39–117)
ALT: 51 U/L (ref 0–53)
AST: 42 U/L — AB (ref 0–37)
Albumin: 4.2 g/dL (ref 3.5–5.2)
BILIRUBIN DIRECT: 0.2 mg/dL (ref 0.0–0.3)
BILIRUBIN TOTAL: 0.7 mg/dL (ref 0.2–1.2)
TOTAL PROTEIN: 7.4 g/dL (ref 6.0–8.3)

## 2015-11-18 LAB — CBC WITH DIFFERENTIAL/PLATELET
BASOS ABS: 0 10*3/uL (ref 0.0–0.1)
BASOS PCT: 0.3 % (ref 0.0–3.0)
EOS ABS: 0.4 10*3/uL (ref 0.0–0.7)
Eosinophils Relative: 4.6 % (ref 0.0–5.0)
HEMATOCRIT: 41.3 % (ref 39.0–52.0)
Hemoglobin: 14.2 g/dL (ref 13.0–17.0)
LYMPHS ABS: 1.4 10*3/uL (ref 0.7–4.0)
LYMPHS PCT: 18.1 % (ref 12.0–46.0)
MCHC: 34.4 g/dL (ref 30.0–36.0)
MCV: 89.8 fl (ref 78.0–100.0)
MONO ABS: 0.8 10*3/uL (ref 0.1–1.0)
Monocytes Relative: 9.8 % (ref 3.0–12.0)
NEUTROS ABS: 5.4 10*3/uL (ref 1.4–7.7)
NEUTROS PCT: 67.2 % (ref 43.0–77.0)
PLATELETS: 270 10*3/uL (ref 150.0–400.0)
RBC: 4.6 Mil/uL (ref 4.22–5.81)
RDW: 13.7 % (ref 11.5–15.5)
WBC: 8 10*3/uL (ref 4.0–10.5)

## 2015-11-18 LAB — URINALYSIS, ROUTINE W REFLEX MICROSCOPIC
BILIRUBIN URINE: NEGATIVE
HGB URINE DIPSTICK: NEGATIVE
Ketones, ur: NEGATIVE
Leukocytes, UA: NEGATIVE
NITRITE: NEGATIVE
Specific Gravity, Urine: 1.015 (ref 1.000–1.030)
TOTAL PROTEIN, URINE-UPE24: NEGATIVE
Urine Glucose: NEGATIVE
Urobilinogen, UA: 1 (ref 0.0–1.0)
pH: 6 (ref 5.0–8.0)

## 2015-11-18 LAB — BASIC METABOLIC PANEL
BUN: 15 mg/dL (ref 6–23)
CHLORIDE: 102 meq/L (ref 96–112)
CO2: 27 meq/L (ref 19–32)
CREATININE: 0.98 mg/dL (ref 0.40–1.50)
Calcium: 9.5 mg/dL (ref 8.4–10.5)
GFR: 79.51 mL/min (ref >60.00)
Glucose, Bld: 114 mg/dL — ABNORMAL HIGH (ref 70–99)
POTASSIUM: 4.1 meq/L (ref 3.5–5.1)
SODIUM: 136 meq/L (ref 135–145)

## 2015-11-18 LAB — SEDIMENTATION RATE: Sed Rate: 3 mm/hr (ref 0–22)

## 2015-11-20 NOTE — Assessment & Plan Note (Addendum)
Etiology unclear., ? Viral vs other, for cxr, UA, labs, blood culture, empiric levaquin asd,  to f/u any worsening symptoms or concerns

## 2015-11-24 LAB — CULTURE, BLOOD (SINGLE): ORGANISM ID, BACTERIA: NO GROWTH

## 2015-11-30 ENCOUNTER — Other Ambulatory Visit: Payer: Self-pay | Admitting: Internal Medicine

## 2016-04-25 ENCOUNTER — Ambulatory Visit (INDEPENDENT_AMBULATORY_CARE_PROVIDER_SITE_OTHER): Payer: Medicare Other | Admitting: Internal Medicine

## 2016-04-25 ENCOUNTER — Other Ambulatory Visit (INDEPENDENT_AMBULATORY_CARE_PROVIDER_SITE_OTHER): Payer: Medicare Other

## 2016-04-25 ENCOUNTER — Encounter: Payer: Self-pay | Admitting: Internal Medicine

## 2016-04-25 VITALS — BP 132/72 | HR 65 | Temp 97.8°F | Resp 20 | Wt 166.4 lb

## 2016-04-25 DIAGNOSIS — Z0000 Encounter for general adult medical examination without abnormal findings: Secondary | ICD-10-CM | POA: Diagnosis not present

## 2016-04-25 DIAGNOSIS — Z23 Encounter for immunization: Secondary | ICD-10-CM

## 2016-04-25 LAB — PSA: PSA: 1.18 ng/mL (ref 0.10–4.00)

## 2016-04-25 LAB — CBC WITH DIFFERENTIAL/PLATELET
BASOS PCT: 0.4 % (ref 0.0–3.0)
Basophils Absolute: 0 10*3/uL (ref 0.0–0.1)
EOS PCT: 5.1 % — AB (ref 0.0–5.0)
Eosinophils Absolute: 0.4 10*3/uL (ref 0.0–0.7)
HEMATOCRIT: 44.1 % (ref 39.0–52.0)
Hemoglobin: 15.2 g/dL (ref 13.0–17.0)
LYMPHS PCT: 17.4 % (ref 12.0–46.0)
Lymphs Abs: 1.5 10*3/uL (ref 0.7–4.0)
MCHC: 34.4 g/dL (ref 30.0–36.0)
MCV: 93 fl (ref 78.0–100.0)
MONOS PCT: 8.3 % (ref 3.0–12.0)
Monocytes Absolute: 0.7 10*3/uL (ref 0.1–1.0)
NEUTROS ABS: 5.9 10*3/uL (ref 1.4–7.7)
Neutrophils Relative %: 68.8 % (ref 43.0–77.0)
PLATELETS: 234 10*3/uL (ref 150.0–400.0)
RBC: 4.74 Mil/uL (ref 4.22–5.81)
RDW: 13.1 % (ref 11.5–15.5)
WBC: 8.6 10*3/uL (ref 4.0–10.5)

## 2016-04-25 LAB — URINALYSIS, ROUTINE W REFLEX MICROSCOPIC
BILIRUBIN URINE: NEGATIVE
Hgb urine dipstick: NEGATIVE
Ketones, ur: NEGATIVE
LEUKOCYTES UA: NEGATIVE
NITRITE: NEGATIVE
PH: 5.5 (ref 5.0–8.0)
RBC / HPF: NONE SEEN (ref 0–?)
Specific Gravity, Urine: <=1.005 — AB (ref 1.000–1.030)
TOTAL PROTEIN, URINE-UPE24: NEGATIVE
URINE GLUCOSE: NEGATIVE
UROBILINOGEN UA: 0.2 (ref 0.0–1.0)

## 2016-04-25 LAB — HEPATIC FUNCTION PANEL
ALT: 26 U/L (ref 0–53)
AST: 23 U/L (ref 0–37)
Albumin: 4.1 g/dL (ref 3.5–5.2)
Alkaline Phosphatase: 40 U/L (ref 39–117)
BILIRUBIN DIRECT: 0.2 mg/dL (ref 0.0–0.3)
BILIRUBIN TOTAL: 0.8 mg/dL (ref 0.2–1.2)
Total Protein: 7 g/dL (ref 6.0–8.3)

## 2016-04-25 LAB — BASIC METABOLIC PANEL
BUN: 16 mg/dL (ref 6–23)
CALCIUM: 9.1 mg/dL (ref 8.4–10.5)
CHLORIDE: 102 meq/L (ref 96–112)
CO2: 29 mEq/L (ref 19–32)
CREATININE: 1.01 mg/dL (ref 0.40–1.50)
GFR: 76.7 mL/min (ref >60.00)
Glucose, Bld: 96 mg/dL (ref 70–99)
Potassium: 4.2 mEq/L (ref 3.5–5.1)
Sodium: 138 mEq/L (ref 135–145)

## 2016-04-25 LAB — LIPID PANEL
CHOL/HDL RATIO: 3
Cholesterol: 119 mg/dL (ref 0–200)
HDL: 34.5 mg/dL — AB (ref >39.00)
LDL Cholesterol: 48 mg/dL (ref 0–99)
NONHDL: 84.59
Triglycerides: 184 mg/dL — ABNORMAL HIGH (ref 0.0–149.0)
VLDL: 36.8 mg/dL (ref 0.0–40.0)

## 2016-04-25 LAB — TSH: TSH: 1.18 u[IU]/mL (ref 0.35–4.50)

## 2016-04-25 MED ORDER — ROSUVASTATIN CALCIUM 20 MG PO TABS: 20.0000 mg | ORAL_TABLET | Freq: Every day | ORAL | 3 refills | Status: DC

## 2016-04-25 MED ORDER — FLUTICASONE PROPIONATE 50 MCG/ACT NA SUSP: 2.0000 | Freq: Every day | NASAL | 3 refills | Status: DC

## 2016-04-25 MED ORDER — ASPIRIN 81 MG PO TBEC: DELAYED_RELEASE_TABLET | ORAL | 3 refills | Status: DC

## 2016-04-25 MED ORDER — OMEPRAZOLE 40 MG PO CPDR: 40.0000 mg | DELAYED_RELEASE_CAPSULE | Freq: Two times a day (BID) | ORAL | 3 refills | Status: DC

## 2016-04-25 NOTE — Progress Notes (Signed)
Subjective:    Patient ID: Brian Branch, male    DOB: 07-14-1941, 75 y.o.   MRN: [id number]  HPI  Here for wellness and f/u;  Overall doing ok;  Pt denies Chest pain, worsening SOB, DOE, wheezing, orthopnea, PND, worsening LE edema, palpitations, dizziness or syncope.  Pt denies neurological change such as new headache, facial or extremity weakness.  Pt denies polydipsia, polyuria, or low sugar symptoms. Pt states overall good compliance with treatment and medications, good tolerability, and has been trying to follow appropriate diet.  Pt denies worsening depressive symptoms, suicidal ideation or panic. No fever, night sweats, wt loss, loss of appetite, or other constitutional symptoms.  Pt states good ability with ADL's, has low fall risk, home safety reviewed and adequate, no other significant changes in hearing or vision, and only occasionally active with exercise, admits to sedentary lifestyle. Has no specific complaints Past Medical History:  Diagnosis Date  . ALLERGIC RHINITIS 02/15/2007  . BENIGN PROSTATIC HYPERTROPHY 02/15/2007  . CAD (coronary artery disease)   . COLONIC POLYPS, HX OF 08/29/2007  . CORONARY ARTERY DISEASE 02/15/2007  . 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 DEPRESSION 08/29/2007   pt denies having depression  . ED (erectile dysfunction)   . FATIGUE 08/29/2007  . Heart attack    [place]  . HYPERLIPIDEMIA 02/15/2007  . INSOMNIA-SLEEP DISORDER-UNSPEC 03/09/2010  . KNEE PAIN, LEFT 08/29/2007  . 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 SHINGLES, 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  . TOBACCO ABUSE 03/09/2010  . Unspecified eustachian tube disorder 03/09/2010   Past Surgical History:  Procedure Laterality Date  . APPENDECTOMY  1951  . CORONARY ARTERY BYPASS GRAFT  1991  . TONSILLECTOMY AND ADENOIDECTOMY      reports that he quit smoking about 7 years ago. He has never used smokeless tobacco. He reports that he drinks about 1.2 oz of alcohol per week . He reports that he  does not use drugs. family history includes Arthritis in his mother; Heart disease in his father. Allergies  Allergen Reactions  . Levitra [Vardenafil Hydrochloride]     headache  . Lincomycin Hives  . Penicillins     REACTION: Hives  . Tadalafil     REACTION: headache  . Triazolam     REACTION: hallucinations   Review of Systems Constitutional: Negative for increased diaphoresis, or other activity, appetite or siginficant weight change other than noted HENT: Negative for worsening hearing loss, ear pain, facial swelling, mouth sores and neck stiffness.   Eyes: Negative for other worsening pain, redness or visual disturbance.  Respiratory: Negative for choking or stridor Cardiovascular: Negative for other chest pain and palpitations.  Gastrointestinal: Negative for worsening diarrhea, blood in stool, or abdominal distention Genitourinary: Negative for hematuria, flank pain or change in urine volume.  Musculoskeletal: Negative for myalgias or other joint complaints.  Skin: Negative for other color change and wound or drainage.  Neurological: Negative for syncope and numbness. other than noted Hematological: Negative for adenopathy. or other swelling Psychiatric/Behavioral: Negative for hallucinations, SI, self-injury, decreased concentration or other worsening agitation.      Objective:   Physical Exam BP 132/72   Pulse 65   Temp 97.8 F (36.6 C) (Oral)   Resp 20   Wt 166 lb 6 oz (75.5 kg)   SpO2 97%   BMI 26.85 kg/m  VS noted, mild obese Constitutional: Pt is oriented to person, place, and time. Appears well-developed and well-nourished, in no significant distress Head: Normocephalic  and atraumatic  Eyes: Conjunctivae and EOM are normal. Pupils are equal, round, and reactive to light Right Ear: External ear normal.  Left Ear: External ear normal Nose: Nose normal.  Mouth/Throat: Oropharynx is clear and moist  Neck: Normal range of motion. Neck supple. No JVD present.  No tracheal deviation present or significant neck LA or mass Cardiovascular: Normal rate, regular rhythm, normal heart sounds and intact distal pulses.   Pulmonary/Chest: Effort normal and breath sounds without rales or wheezing  Abdominal: Soft. Bowel sounds are normal. NT. No HSM  Musculoskeletal: Normal range of motion. Exhibits no edema Lymphadenopathy: Has no cervical adenopathy.  Neurological: Pt is alert and oriented to person, place, and time. Pt has normal reflexes. No cranial nerve deficit. Motor grossly intact Skin: Skin is warm and dry. No rash noted or new ulcers Psychiatric:  Has normal somewhat irritable mood and affect. Behavior is normal.      Assessment & Plan:

## 2016-04-25 NOTE — Patient Instructions (Addendum)

## 2016-04-25 NOTE — Progress Notes (Signed)
Pre visit review using our clinic review tool, if applicable. No additional management support is needed unless otherwise documented below in the visit note. 

## 2016-04-28 NOTE — Assessment & Plan Note (Signed)

## 2016-05-15 ENCOUNTER — Ambulatory Visit (INDEPENDENT_AMBULATORY_CARE_PROVIDER_SITE_OTHER): Payer: Medicare Other | Admitting: Internal Medicine

## 2016-05-15 ENCOUNTER — Encounter: Payer: Self-pay | Admitting: Internal Medicine

## 2016-05-15 VITALS — BP 136/80 | HR 66 | Temp 98.1°F | Resp 20 | Wt 166.0 lb

## 2016-05-15 DIAGNOSIS — R1032 Left lower quadrant pain: Secondary | ICD-10-CM

## 2016-05-15 DIAGNOSIS — R0600 Dyspnea, unspecified: Secondary | ICD-10-CM | POA: Diagnosis not present

## 2016-05-15 NOTE — Progress Notes (Signed)
Subjective:    Patient ID: Brian Branch, male    DOB: [birth year], 75 y.o.   MRN: [id number]  HPI  Here with pt denies chest pain, increased sob or doe, wheezing, orthopnea, PND, increased LE swelling, palpitations, dizziness or syncope, except for episode sob/doe after raking leaves for several days, now improved.  Also with c/o LLQ pain oct12 -21, no fever but tender BM's normal, helped pain short time, then would come back, no n/v, no distension but gassy from below, 3 days ago resolved, just wondering what it might have been Past Medical History:  Diagnosis Date  . ALLERGIC RHINITIS 02/15/2007  . BENIGN PROSTATIC HYPERTROPHY 02/15/2007  . CAD (coronary artery disease)   . COLONIC POLYPS, HX OF 08/29/2007  . CORONARY ARTERY DISEASE 02/15/2007  . 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 DEPRESSION 08/29/2007   pt denies having depression  . ED (erectile dysfunction)   . FATIGUE 08/29/2007  . Heart attack    [place]  . HYPERLIPIDEMIA 02/15/2007  . INSOMNIA-SLEEP DISORDER-UNSPEC 03/09/2010  . KNEE PAIN, LEFT 08/29/2007  . 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 SHINGLES, 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  . TOBACCO ABUSE 03/09/2010  . Unspecified eustachian tube disorder 03/09/2010   Past Surgical History:  Procedure Laterality Date  . APPENDECTOMY  1951  . CORONARY ARTERY BYPASS GRAFT  1991  . TONSILLECTOMY AND ADENOIDECTOMY      reports that he quit smoking about 7 years ago. He has never used smokeless tobacco. He reports that he drinks about 1.2 oz of alcohol per week . He reports that he does not use drugs. family history includes Arthritis in his mother; Heart disease in his father. Allergies  Allergen Reactions  . Levitra [Vardenafil Hydrochloride]     headache  . Lincomycin Hives  . Penicillins     REACTION: Hives  . Tadalafil     REACTION: headache  . Triazolam     REACTION: hallucinations   Current Outpatient Prescriptions on File Prior to Visit  Medication Sig  Dispense Refill  . aspirin (ASPIRIN LOW DOSE) 81 MG EC tablet TAKE 1 TABLET DAILY.  SWALLOW WHOLE 90 tablet 3  . Boswellia-Glucosamine-Vit D (HM GLUCOSAMINE & VITAMIN D3) TABS Take 1 tablet by mouth 2 (two) times daily.    . fluticasone (FLONASE) 50 MCG/ACT nasal spray Place 2 sprays into both nostrils daily. 48 g 3  . omeprazole (PRILOSEC) 40 MG capsule Take 1 capsule (40 mg total) by mouth 2 (two) times daily. 180 capsule 3  . rosuvastatin (CRESTOR) 20 MG tablet Take 1 tablet (20 mg total) by mouth daily. 90 tablet 3   No current facility-administered medications on file prior to visit.    Review of Systems  Constitutional: Negative for unusual diaphoresis or night sweats HENT: Negative for ear swelling or discharge Eyes: Negative for worsening visual haziness  Respiratory: Negative for choking and stridor.   Gastrointestinal: Negative for distension or worsening eructation Genitourinary: Negative for retention or change in urine volume.  Musculoskeletal: Negative for other MSK pain or swelling Skin: Negative for color change and worsening wound Neurological: Negative for tremors and numbness other than noted  Psychiatric/Behavioral: Negative for decreased concentration or agitation other than above   All other sytem neg per pt    Objective:   Physical Exam BP 136/80   Pulse 66   Temp 98.1 F (36.7 C) (Oral)   Resp 20   Wt 166 lb (75.3 kg)   SpO2 96%  BMI 26.79 kg/m  VS noted,  Constitutional: Pt appears in no apparent distress HENT: Head: NCAT.  Right Ear: External ear normal.  Left Ear: External ear normal.  Eyes: . Pupils are equal, round, and reactive to light. Conjunctivae and EOM are normal Neck: Normal range of motion. Neck supple.  Cardiovascular: Normal rate and regular rhythm.   Pulmonary/Chest: Effort normal and breath sounds without rales or wheezing.  Abd:  Soft, NT, ND, + BS Neurological: Pt is alert. Not confused , motor grossly intact Skin: Skin is warm.  No rash, no LE edema Psychiatric: Pt behavior is normal. No agitation.  No other significant new exam findings    Assessment & Plan:

## 2016-05-15 NOTE — Progress Notes (Signed)
Pre visit review using our clinic review tool, if applicable. No additional management support is needed unless otherwise documented below in the visit note. 

## 2016-05-21 DIAGNOSIS — R1032 Left lower quadrant pain: Secondary | ICD-10-CM | POA: Insufficient documentation

## 2016-05-21 DIAGNOSIS — R0600 Dyspnea, unspecified: Secondary | ICD-10-CM | POA: Insufficient documentation

## 2016-05-21 NOTE — Assessment & Plan Note (Signed)
Exam benign, sat ok, but suspect mild allergic asthma reaction which resolved without specific tx,  to f/u any worsening symptoms or concerns

## 2016-05-21 NOTE — Assessment & Plan Note (Signed)
Exam benign, suspect functional issue now improved, consider miralax daily prn,  to f/u any worsening symptoms or concerns

## 2016-05-21 NOTE — Patient Instructions (Signed)
Please continue all other medications as before, and refills have been done if requested.  Please have the pharmacy call with any other refills you may need.  Please continue your efforts at being more active, low cholesterol diet, and weight control.  You are otherwise up to date with prevention measures today.  Please keep your appointments with your specialists as you may have planned   

## 2016-06-12 ENCOUNTER — Other Ambulatory Visit: Payer: Self-pay | Admitting: Internal Medicine

## 2017-04-26 ENCOUNTER — Ambulatory Visit (INDEPENDENT_AMBULATORY_CARE_PROVIDER_SITE_OTHER): Payer: Medicare Other | Admitting: Internal Medicine

## 2017-04-26 ENCOUNTER — Encounter: Payer: Self-pay | Admitting: Internal Medicine

## 2017-04-26 ENCOUNTER — Encounter: Payer: Self-pay | Admitting: Gastroenterology

## 2017-04-26 ENCOUNTER — Other Ambulatory Visit (INDEPENDENT_AMBULATORY_CARE_PROVIDER_SITE_OTHER): Payer: Medicare Other

## 2017-04-26 VITALS — BP 116/72 | HR 63 | Temp 97.8°F | Ht 66.0 in | Wt 162.0 lb

## 2017-04-26 DIAGNOSIS — Z0000 Encounter for general adult medical examination without abnormal findings: Secondary | ICD-10-CM

## 2017-04-26 DIAGNOSIS — R252 Cramp and spasm: Secondary | ICD-10-CM

## 2017-04-26 DIAGNOSIS — Z86010 Personal history of colonic polyps: Secondary | ICD-10-CM | POA: Diagnosis not present

## 2017-04-26 DIAGNOSIS — Z23 Encounter for immunization: Secondary | ICD-10-CM

## 2017-04-26 LAB — MAGNESIUM: MAGNESIUM: 2.2 mg/dL (ref 1.5–2.5)

## 2017-04-26 LAB — CBC WITH DIFFERENTIAL/PLATELET
BASOS PCT: 0.6 % (ref 0.0–3.0)
Basophils Absolute: 0.1 10*3/uL (ref 0.0–0.1)
EOS ABS: 0.3 10*3/uL (ref 0.0–0.7)
EOS PCT: 3 % (ref 0.0–5.0)
HEMATOCRIT: 46.9 % (ref 39.0–52.0)
HEMOGLOBIN: 15.8 g/dL (ref 13.0–17.0)
Lymphocytes Relative: 14 % (ref 12.0–46.0)
Lymphs Abs: 1.5 10*3/uL (ref 0.7–4.0)
MCHC: 33.6 g/dL (ref 30.0–36.0)
MCV: 94.4 fl (ref 78.0–100.0)
MONO ABS: 0.8 10*3/uL (ref 0.1–1.0)
Monocytes Relative: 7.2 % (ref 3.0–12.0)
Neutro Abs: 8.1 10*3/uL — ABNORMAL HIGH (ref 1.4–7.7)
Neutrophils Relative %: 75.2 % (ref 43.0–77.0)
PLATELETS: 250 10*3/uL (ref 150.0–400.0)
RBC: 4.97 Mil/uL (ref 4.22–5.81)
RDW: 13.3 % (ref 11.5–15.5)
WBC: 10.8 10*3/uL — AB (ref 4.0–10.5)

## 2017-04-26 LAB — LIPID PANEL
Cholesterol: 128 mg/dL (ref 0–200)
HDL: 37.1 mg/dL — ABNORMAL LOW (ref >39.00)
LDL Cholesterol: 59 mg/dL (ref 0–99)
NONHDL: 90.97
Total CHOL/HDL Ratio: 3
Triglycerides: 161 mg/dL — ABNORMAL HIGH (ref 0.0–149.0)
VLDL: 32.2 mg/dL (ref 0.0–40.0)

## 2017-04-26 LAB — URINALYSIS, ROUTINE W REFLEX MICROSCOPIC
Bilirubin Urine: NEGATIVE
Hgb urine dipstick: NEGATIVE
Ketones, ur: NEGATIVE
Leukocytes, UA: NEGATIVE
Nitrite: NEGATIVE
PH: 6 (ref 5.0–8.0)
RBC / HPF: NONE SEEN (ref 0–?)
SPECIFIC GRAVITY, URINE: 1.02 (ref 1.000–1.030)
TOTAL PROTEIN, URINE-UPE24: NEGATIVE
Urine Glucose: NEGATIVE
Urobilinogen, UA: 0.2 (ref 0.0–1.0)
WBC UA: NONE SEEN (ref 0–?)

## 2017-04-26 LAB — HEPATIC FUNCTION PANEL
ALK PHOS: 45 U/L (ref 39–117)
ALT: 25 U/L (ref 0–53)
AST: 23 U/L (ref 0–37)
Albumin: 4.6 g/dL (ref 3.5–5.2)
BILIRUBIN DIRECT: 0.2 mg/dL (ref 0.0–0.3)
TOTAL PROTEIN: 7.3 g/dL (ref 6.0–8.3)
Total Bilirubin: 1.1 mg/dL (ref 0.2–1.2)

## 2017-04-26 LAB — BASIC METABOLIC PANEL
BUN: 20 mg/dL (ref 6–23)
CHLORIDE: 100 meq/L (ref 96–112)
CO2: 27 meq/L (ref 19–32)
CREATININE: 1.07 mg/dL (ref 0.40–1.50)
Calcium: 9.6 mg/dL (ref 8.4–10.5)
GFR: 71.56 mL/min (ref >60.00)
Glucose, Bld: 108 mg/dL — ABNORMAL HIGH (ref 70–99)
POTASSIUM: 4.4 meq/L (ref 3.5–5.1)
SODIUM: 137 meq/L (ref 135–145)

## 2017-04-26 LAB — PSA: PSA: 1.98 ng/mL (ref 0.10–4.00)

## 2017-04-26 LAB — TSH: TSH: 0.95 u[IU]/mL (ref 0.35–4.50)

## 2017-04-26 MED ORDER — ROSUVASTATIN CALCIUM 20 MG PO TABS: 20.0000 mg | ORAL_TABLET | Freq: Every day | ORAL | 3 refills | Status: DC

## 2017-04-26 MED ORDER — OMEPRAZOLE 40 MG PO CPDR: 40.0000 mg | DELAYED_RELEASE_CAPSULE | Freq: Two times a day (BID) | ORAL | 3 refills | Status: DC

## 2017-04-26 MED ORDER — TRIAMCINOLONE ACETONIDE 55 MCG/ACT NA AERO: 2.0000 | INHALATION_SPRAY | Freq: Every day | NASAL | 3 refills | Status: DC

## 2017-04-26 NOTE — Progress Notes (Signed)
Subjective:    Patient ID: Brian Branch, male    DOB: September 03, 1940, 77 y.o.   MRN: [id number]  HPI  Here for wellness and f/u;  Overall doing ok;  Pt denies Chest pain, worsening SOB, DOE, wheezing, orthopnea, PND, worsening LE edema, palpitations, dizziness or syncope.  Pt denies neurological change such as new headache, facial or extremity weakness, but did have some unusual left sided HA for several wks recently without rash but tender scalp, now resolved.  Pt denies polydipsia, polyuria, or low sugar symptoms. Pt states overall good compliance with treatment and medications, good tolerability, and has been trying to follow appropriate diet.  Pt denies worsening depressive symptoms, suicidal ideation or panic. No fever, night sweats, wt loss, loss of appetite, or other constitutional symptoms.  Pt states good ability with ADL's, has low fall risk, home safety reviewed and adequate, no other significant changes in hearing or vision, and only occasionally active with exercise.  Does also have toes, feet, thumb cramps occasionally. Past Medical History:  Diagnosis Date  . ALLERGIC RHINITIS 02/15/2007  . BENIGN PROSTATIC HYPERTROPHY 02/15/2007  . CAD (coronary artery disease)   . COLONIC POLYPS, HX OF 08/29/2007  . CORONARY ARTERY DISEASE 02/15/2007  . 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 DEPRESSION 08/29/2007   pt denies having depression  . ED (erectile dysfunction)   . FATIGUE 08/29/2007  . Heart attack ([place])    [place]  . HYPERLIPIDEMIA 02/15/2007  . INSOMNIA-SLEEP DISORDER-UNSPEC 03/09/2010  . KNEE PAIN, LEFT 08/29/2007  . 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 SHINGLES, 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  . TOBACCO ABUSE 03/09/2010  . Unspecified eustachian tube disorder 03/09/2010   Past Surgical History:  Procedure Laterality Date  . APPENDECTOMY  1951  . CORONARY ARTERY BYPASS GRAFT  1991  . TONSILLECTOMY AND ADENOIDECTOMY      reports that he quit smoking about 8 years ago. He  has never used smokeless tobacco. He reports that he drinks about 1.2 oz of alcohol per week . He reports that he does not use drugs. family history includes Arthritis in his mother; Heart disease in his father. Allergies  Allergen Reactions  . Levitra [Vardenafil Hydrochloride]     headache  . Lincomycin Hives  . Penicillins     REACTION: Hives  . Tadalafil     REACTION: headache  . Triazolam     REACTION: hallucinations   Current Outpatient Prescriptions on File Prior to Visit  Medication Sig Dispense Refill  . aspirin (ASPIRIN LOW DOSE) 81 MG EC tablet TAKE 1 TABLET DAILY.  SWALLOW WHOLE 90 tablet 3  . [place]well[place]-Glucosamine-Vit D (HM GLUCOSAMINE & VITAMIN D3) TABS Take 1 tablet by mouth 2 (two) times daily.     No current facility-administered medications on file prior to visit.    Review of Systems  Constitutional: Negative for other unusual diaphoresis, sweats, appetite or weight changes HENT: Negative for other worsening hearing loss, ear pain, facial swelling, mouth sores or neck stiffness.   Eyes: Negative for other worsening pain, redness or other visual disturbance.  Respiratory: Negative for other stridor or swelling Cardiovascular: Negative for other palpitations or other chest pain  Gastrointestinal: Negative for worsening diarrhea or loose stools, blood in stool, distention or other pain Genitourinary: Negative for hematuria, flank pain or other change in urine volume.  Musculoskeletal: Negative for myalgias or other joint swelling.  Skin: Negative for other color change, or other wound or worsening drainage.  Neurological: Negative  for other syncope or numbness. Hematological: Negative for other adenopathy or swelling Psychiatric/Behavioral: Negative for hallucinations, other worsening agitation, SI, self-injury, or new decreased concentration All other system neg per pt     Objective:   Physical Exam BP 116/72   Pulse 63   Temp 97.8 F (36.6 C) (Oral)   Ht 5'  6" (1.676 m)   Wt 162 lb (73.5 kg)   SpO2 99%   BMI 26.15 kg/m  VS noted,  Constitutional: Pt is oriented to person, place, and time. Appears well-developed and well-nourished, in no significant distress and comfortable Head: Normocephalic and atraumatic  Eyes: Conjunctivae and EOM are normal. Pupils are equal, round, and reactive to light Right Ear: External ear normal without discharge Left Ear: External ear normal without discharge Nose: Nose without discharge or deformity Mouth/Throat: Oropharynx is without other ulcerations and moist  Neck: Normal range of motion. Neck supple. No JVD present. No tracheal deviation present or significant neck LA or mass Cardiovascular: Normal rate, regular rhythm, normal heart sounds and intact distal pulses.   Pulmonary/Chest: WOB normal and breath sounds without rales or wheezing  Abdominal: Soft. Bowel sounds are normal. NT. No HSM  Musculoskeletal: Normal range of motion. Exhibits no edema Lymphadenopathy: Has no other cervical adenopathy.  Neurological: Pt is alert and oriented to person, place, and time. Pt has normal reflexes. No cranial nerve deficit. Motor grossly intact, Gait intact Skin: Skin is warm and dry. No rash noted or new ulcerations Psychiatric:  Has normal mood and affect. Behavior is normal without agitation No other exam findings Lab Results  Component Value Date   WBC 10.8 (H) 04/26/2017   HGB 15.8 04/26/2017   HCT 46.9 04/26/2017   PLT 250.0 04/26/2017   GLUCOSE 108 (H) 04/26/2017   CHOL 128 04/26/2017   TRIG 161.0 (H) 04/26/2017   HDL 37.10 (L) 04/26/2017   LDLDIRECT 79.0 04/09/2012   LDLCALC 59 04/26/2017   ALT 25 04/26/2017   AST 23 04/26/2017   NA 137 04/26/2017   K 4.4 04/26/2017   CL 100 04/26/2017   CREATININE 1.07 04/26/2017   BUN 20 04/26/2017   CO2 27 04/26/2017   TSH 0.95 04/26/2017   PSA 1.98 04/26/2017   HGBA1C 6.0 03/08/2009      Assessment & Plan:

## 2017-04-26 NOTE — Patient Instructions (Addendum)
You will be contacted regarding the referral for: colonoscopy for Dec or after  Please continue all other medications as before, and refills have been done if requested.  Please have the pharmacy call with any other refills you may need.  Please continue your efforts at being more active, low cholesterol diet, and weight control.  You are otherwise up to date with prevention measures today.  Please keep your appointments with your specialists as you may have planned  Please return in 1 year for your yearly visit, or sooner if needed, with Lab testing done 3-5 days before

## 2017-04-27 NOTE — Assessment & Plan Note (Signed)
Will be due for colonoscopy in dec, will order

## 2017-04-27 NOTE — Assessment & Plan Note (Signed)

## 2017-04-27 NOTE — Assessment & Plan Note (Signed)
Also for mag level

## 2017-05-07 ENCOUNTER — Encounter: Payer: Self-pay | Admitting: Internal Medicine

## 2017-05-07 MED ORDER — FLUTICASONE PROPIONATE 50 MCG/ACT NA SUSP: 2.0000 | Freq: Every day | NASAL | 3 refills | Status: DC

## 2017-06-03 ENCOUNTER — Other Ambulatory Visit: Payer: Self-pay | Admitting: Internal Medicine

## 2017-06-06 ENCOUNTER — Ambulatory Visit (AMBULATORY_SURGERY_CENTER): Payer: Self-pay | Admitting: *Deleted

## 2017-06-06 ENCOUNTER — Other Ambulatory Visit: Payer: Self-pay

## 2017-06-06 VITALS — Ht 65.0 in | Wt 166.0 lb

## 2017-06-06 DIAGNOSIS — Z86010 Personal history of colonic polyps: Secondary | ICD-10-CM

## 2017-06-06 MED ORDER — NA SULFATE-K SULFATE-MG SULF 17.5-3.13-1.6 GM/177ML PO SOLN: 1.0000 | Freq: Once | ORAL | 0 refills | Status: AC

## 2017-06-06 NOTE — Progress Notes (Signed)
No egg or soy allergy known to patient  No issues with past sedation with any surgeries  or procedures, no intubation problems  No diet pills per patient No home 02 use per patient  No blood thinners per patient  Pt denies issues with constipation  No A fib or A flutter  EMMI video sent to pt's e mail pt declined   

## 2017-06-18 ENCOUNTER — Encounter: Payer: Self-pay | Admitting: Gastroenterology

## 2017-06-27 ENCOUNTER — Ambulatory Visit (AMBULATORY_SURGERY_CENTER): Payer: Medicare Other | Admitting: Gastroenterology

## 2017-06-27 ENCOUNTER — Other Ambulatory Visit: Payer: Self-pay

## 2017-06-27 ENCOUNTER — Encounter: Payer: Self-pay | Admitting: Gastroenterology

## 2017-06-27 VITALS — BP 103/59 | HR 64 | Temp 98.0°F | Resp 18 | Ht 65.0 in | Wt 166.0 lb

## 2017-06-27 DIAGNOSIS — Z86010 Personal history of colonic polyps: Secondary | ICD-10-CM

## 2017-06-27 DIAGNOSIS — D123 Benign neoplasm of transverse colon: Secondary | ICD-10-CM

## 2017-06-27 DIAGNOSIS — D122 Benign neoplasm of ascending colon: Secondary | ICD-10-CM | POA: Diagnosis not present

## 2017-06-27 DIAGNOSIS — D120 Benign neoplasm of cecum: Secondary | ICD-10-CM | POA: Diagnosis not present

## 2017-06-27 MED ORDER — SODIUM CHLORIDE 0.9 % IV SOLN: 500.0000 mL | Freq: Once | INTRAVENOUS | Status: DC

## 2017-06-27 NOTE — Progress Notes (Signed)
Report to PACU, RN, vss, BBS= Clear.  

## 2017-06-27 NOTE — Patient Instructions (Signed)
**   Handouts given on polyps and diverticulosis **   YOU HAD AN ENDOSCOPIC PROCEDURE TODAY AT THE [facility]:   Refer to the procedure report that was given to you for any specific questions about what was found during the examination.  If the procedure report does not answer your questions, please call your gastroenterologist to clarify.  If you requested that your care partner not be given the details of your procedure findings, then the procedure report has been included in a sealed envelope for you to review at your convenience later.  YOU SHOULD EXPECT: Some feelings of bloating in the abdomen. Passage of more gas than usual.  Walking can help get rid of the air that was put into your GI tract during the procedure and reduce the bloating. If you had a lower endoscopy (such as a colonoscopy or flexible sigmoidoscopy) you may notice spotting of blood in your stool or on the toilet paper. If you underwent a bowel prep for your procedure, you may not have a normal bowel movement for a few days.  Please Note:  You might notice some irritation and congestion in your nose or some drainage.  This is from the oxygen used during your procedure.  There is no need for concern and it should clear up in a day or so.  SYMPTOMS TO REPORT IMMEDIATELY:   Following lower endoscopy (colonoscopy or flexible sigmoidoscopy):  Excessive amounts of blood in the stool  Significant tenderness or worsening of abdominal pains  Swelling of the abdomen that is new, acute  Fever of 100F or higher  For urgent or emergent issues, a gastroenterologist can be reached at any hour by calling [phone].   DIET:  We do recommend a small meal at first, but then you may proceed to your regular diet.  Drink plenty of fluids but you should avoid alcoholic beverages for 24 hours.  ACTIVITY:  You should plan to take it easy for the rest of today and you should NOT DRIVE or use heavy machinery until tomorrow (because  of the sedation medicines used during the test).    FOLLOW UP: Our staff will call the number listed on your records the next business day following your procedure to check on you and address any questions or concerns that you may have regarding the information given to you following your procedure. If we do not reach you, we will leave a message.  However, if you are feeling well and you are not experiencing any problems, there is no need to return our call.  We will assume that you have returned to your regular daily activities without incident.  If any biopsies were taken you will be contacted by phone or by letter within the next 1-3 weeks.  Please call us at [phone] if you have not heard about the biopsies in 3 weeks.    SIGNATURES/CONFIDENTIALITY: You and/or your care partner have signed paperwork which will be entered into your electronic medical record.  These signatures attest to the fact that that the information above on your After Visit Summary has been reviewed and is understood.  Full responsibility of the confidentiality of this discharge information lies with you and/or your care-partner. 

## 2017-06-27 NOTE — Op Note (Signed)
[place] Patient Name: Brian Branch Procedure Date: 06/27/2017 10:32 AM MRN: [id number] Endoscopist: Ladene Artist , MD Age: 76 Referring MD:  Date of Birth: [birth year] Gender: Male Account #: [id number] Procedure:                Colonoscopy Indications:              Surveillance: Personal history of adenomatous                            polyps on last colonoscopy 3 years ago Medicines:                Monitored Anesthesia Care Procedure:                Pre-Anesthesia Assessment:                           - Prior to the procedure, a History and Physical                            was performed, and patient medications and                            allergies were reviewed. The patient's tolerance of                            previous anesthesia was also reviewed. The risks                            and benefits of the procedure and the sedation                            options and risks were discussed with the patient.                            All questions were answered, and informed consent                            was obtained. Prior Anticoagulants: The patient has                            taken no previous anticoagulant or antiplatelet                            agents. ASA Grade Assessment: II - A patient with                            mild systemic disease. After reviewing the risks                            and benefits, the patient was deemed in                            satisfactory condition to undergo the procedure.  After obtaining informed consent, the colonoscope                            was passed under direct vision. Throughout the                            procedure, the patient's blood pressure, pulse, and                            oxygen saturations were monitored continuously. The                            Colonoscope was introduced through the anus and                            advanced to the the cecum,  identified by                            appendiceal orifice and ileocecal valve. The                            ileocecal valve, appendiceal orifice, and rectum                            were photographed. The quality of the bowel                            preparation was good. The colonoscopy was performed                            without difficulty. The patient tolerated the                            procedure well. Scope In: 10:43:12 AM Scope Out: 10:59:13 AM Scope Withdrawal Time: 0 hours 13 minutes 44 seconds  Total Procedure Duration: 0 hours 16 minutes 1 second  Findings:                 The perianal and digital rectal examinations were                            normal.                           Six sessile polyps were found in the transverse                            colon (2), ascending colon (3) and cecum (1). The                            polyps were 6 to 8 mm in size. These polyps were                            removed with a cold snare. Resection and retrieval  were complete.                           Two sessile polyps were found in the transverse                            colon. The polyps were 5 mm in size. These polyps                            were removed with a cold biopsy forceps. Resection                            and retrieval were complete.                           Multiple medium-mouthed diverticula were found in                            the left colon. There was evidence of diverticular                            spasm. Erythema was seen in association with the                            diverticular opening. There was no evidence of                            diverticular bleeding.                           The exam was otherwise without abnormality on                            direct and retroflexion views. Complications:            No immediate complications. Estimated blood loss:                             None. Estimated Blood Loss:     Estimated blood loss: none. Impression:               - Six 6 to 8 mm polyps in the transverse colon, in                            the ascending colon and in the cecum, removed with                            a cold snare. Resected and retrieved.                           - Two 5 mm polyps in the transverse colon, removed                            with a cold biopsy forceps. Resected and retrieved.                           -  Moderate diverticulosis in the left colon. There                            was evidence of diverticular spasm. Erythema was                            seen in association with the diverticular opening.                            There was no evidence of diverticular bleeding.                           - The examination was otherwise normal on direct                            and retroflexion views. Recommendation:           - Repeat colonoscopy in 2 - 3 years for                            surveillance pending pathology review.                           - Patient has a contact number available for                            emergencies. The signs and symptoms of potential                            delayed complications were discussed with the                            patient. Return to normal activities tomorrow.                            Written discharge instructions were provided to the                            patient.                           - High fiber diet.                           - Continue present medications. Ladene Artist, MD 06/27/2017 11:04:43 AM This report has been signed electronically.

## 2017-06-27 NOTE — Progress Notes (Signed)
Called to room to assist during endoscopic procedure.  Patient ID and intended procedure confirmed with present staff. Received instructions for my participation in the procedure from the performing physician.  

## 2017-06-27 NOTE — Progress Notes (Signed)
Pt's states no medical or surgical changes since previsit or office visit. 

## 2017-06-28 ENCOUNTER — Telehealth: Payer: Self-pay

## 2017-06-28 NOTE — Telephone Encounter (Signed)
  Follow up Call-  Call Brian Branch number 06/27/2017  Post procedure Call Brian Branch phone  # [phone]  Permission to leave phone message Yes  Some recent data might be hidden     Patient questions:  Do you have a fever, pain , or abdominal swelling? No. Pain Score  0 *  Have you tolerated food without any problems? Yes.    Have you been able to return to your normal activities? Yes.    Do you have any questions about your discharge instructions: Diet   No. Medications  No. Follow up visit  No.  Do you have questions or concerns about your Care? No.  Actions: * If pain score is 4 or above: No action needed, pain <4.

## 2017-07-03 ENCOUNTER — Encounter: Payer: Self-pay | Admitting: Gastroenterology

## 2017-10-17 ENCOUNTER — Ambulatory Visit: Payer: Self-pay | Admitting: *Deleted

## 2017-10-17 NOTE — Telephone Encounter (Signed)
FYI

## 2017-10-17 NOTE — Telephone Encounter (Signed)
Pt called with complaints of pain between chest and shoulders; the pt states that it does not feel like cardiac pain; he states he had open heart surgery and has wiring up his sternum; he states he is having a sharp/stabbing sensation ( pt states it feels like someone is stabbing him with a pin)  that only happens when he moves into certain positions; the area is 3-5 inches above his right nipple; it has been happening for the past 3 days; recommendations made per nurse protocol to include seeing a physician within 24 hours; pt states that he would only like  To see Dr [name], pt offered and accepted appointment with Dr [name], [facility] 10/18/17 at 1600; he verbalizes understanding; will route to office for notification of this encounter.  Reason for Disposition . [1] Chest pain lasting <= 5 minutes AND [2] NO chest pain or cardiac symptoms now(Exceptions: pains lasting a few seconds)  Answer Assessment - Initial Assessment Questions 1. LOCATION: "Where does it hurt?"       above right nipple 2. RADIATION: "Does the pain go anywhere else?" (e.g., into neck, jaw, arms, back)     no 3. ONSET: "When did the chest pain begin?" (Minutes, hours or days)      3 days ago 4. PATTERN "Does the pain come and go, or has it been constant since it started?"  "Does it get worse with exertion?"      Worsening with movement; especially right arm and shoulder 5. DURATION: "How long does it last" (e.g., seconds, minutes, hours)     1-2 seconds 6. SEVERITY: "How bad is the pain?"  (e.g., Scale 1-10; mild, moderate, or severe)    - MILD (1-3): doesn't interfere with normal activities     - MODERATE (4-7): interferes with normal activities or awakens from sleep    - SEVERE (8-10): excruciating pain, unable to do any normal activities       moderate 7. CARDIAC RISK FACTORS: "Do you have any history of heart problems or risk factors for heart disease?" (e.g., prior heart attack, angina; high blood pressure,  diabetes, being overweight, high cholesterol, smoking, or strong family history of heart disease)     Prior CABG 1991  prior heart attacks, high cholesterol (take crestor) 8. PULMONARY RISK FACTORS: "Do you have any history of lung disease?"  (e.g., blood clots in lung, asthma, emphysema, birth control pills)     no 9. CAUSE: "What do you think is causing the chest pain?"     Thinks wires may have come staple loose or broken wire 10. OTHER SYMPTOMS: "Do you have any other symptoms?" (e.g., dizziness, nausea, vomiting, sweating, fever, difficulty breathing, cough)       no 11. PREGNANCY: "Is there any chance you are pregnant?" "When was your last menstrual period?"       n\a  Protocols used: CHEST PAIN-A-AH

## 2017-10-18 ENCOUNTER — Ambulatory Visit (INDEPENDENT_AMBULATORY_CARE_PROVIDER_SITE_OTHER)
Admission: RE | Admit: 2017-10-18 | Discharge: 2017-10-18 | Disposition: A | Discharge status: Home / Self Care | Payer: Medicare Other | Source: Ambulatory Visit | Attending: Internal Medicine | Admitting: Internal Medicine

## 2017-10-18 ENCOUNTER — Encounter: Payer: Self-pay | Admitting: Internal Medicine

## 2017-10-18 ENCOUNTER — Ambulatory Visit: Payer: Medicare Other | Admitting: Internal Medicine

## 2017-10-18 VITALS — BP 128/76 | HR 70 | Temp 98.3°F | Ht 65.0 in | Wt 168.0 lb

## 2017-10-18 DIAGNOSIS — F329 Major depressive disorder, single episode, unspecified: Secondary | ICD-10-CM

## 2017-10-18 DIAGNOSIS — R079 Chest pain, unspecified: Secondary | ICD-10-CM | POA: Diagnosis not present

## 2017-10-18 DIAGNOSIS — F32A Depression, unspecified: Secondary | ICD-10-CM

## 2017-10-18 DIAGNOSIS — I493 Ventricular premature depolarization: Secondary | ICD-10-CM

## 2017-10-18 NOTE — Assessment & Plan Note (Signed)
Atypical, very low suspicion cardiac, likely MSK, ecg reviewed, also for CXR but will hold on further stress testing and pt declines cardiology referral

## 2017-10-18 NOTE — Assessment & Plan Note (Signed)
stable overall by history and exam, recent data reviewed with pt, and pt to continue medical treatment as before,  to f/u any worsening symptoms or concerns  

## 2017-10-18 NOTE — Progress Notes (Signed)
Subjective:    Patient ID: Brian Branch, male    DOB: [birth year], 77 y.o.   MRN: [id number]  HPI  Here with acute onset mild to mod right upper parasternal CP x 4 days, sharp, + positional, + mild pleuritic, worse to move right arm across the chest, sort of a stabbing pain without n/v, heart racing, diaphoresis, or dizziness.  Has wires and staples from previous CABG, wonderng aabout broken staples or wires.  No falls or trauma, no skin changes, no fever, cough except for post nasal gtt.  No recent stress testing  No other new complaints  Denies worsening depressive symptoms, suicidal ideation, or panic Past Medical History:  Diagnosis Date  . ALLERGIC RHINITIS 02/15/2007  . Allergy   . BENIGN PROSTATIC HYPERTROPHY 02/15/2007  . CAD (coronary artery disease)   . COLONIC POLYPS, HX OF 08/29/2007  . CORONARY ARTERY DISEASE 02/15/2007  . 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 DEPRESSION 08/29/2007   pt denies having depression  . ED (erectile dysfunction)   . FATIGUE 08/29/2007  . GERD (gastroesophageal reflux disease)   . Heart attack ([place])    [place]  . HYPERLIPIDEMIA 02/15/2007  . INSOMNIA-SLEEP DISORDER-UNSPEC 03/09/2010  . KNEE PAIN, LEFT 08/29/2007  . 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 SHINGLES, 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  . TOBACCO ABUSE 03/09/2010  . Unspecified eustachian tube disorder 03/09/2010   Past Surgical History:  Procedure Laterality Date  . APPENDECTOMY  1951  . COLONOSCOPY    . CORONARY ARTERY BYPASS GRAFT  1991  . POLYPECTOMY    . TONSILLECTOMY AND ADENOIDECTOMY      reports that he quit smoking about 9 years ago. He has never used smokeless tobacco. He reports that he drinks about 1.2 oz of alcohol per week. He reports that he does not use drugs. family history includes Arthritis in his mother; Heart disease in his father. Allergies  Allergen Reactions  . Levitra [Vardenafil Hydrochloride]     headache  . Lincomycin Hives  . Penicillins    REACTION: Hives  . Tadalafil     REACTION: headache  . Triazolam     REACTION: hallucinations   Current Outpatient Medications on File Prior to Visit  Medication Sig Dispense Refill  . aspirin (ASPIRIN LOW DOSE) 81 MG EC tablet TAKE 1 TABLET DAILY, SWALLOW WHOLE 90 tablet 3  . [place]well[place]-Glucosamine-Vit D (HM GLUCOSAMINE & VITAMIN D3) TABS Take 1 tablet by mouth 2 (two) times daily.    . fluticasone (FLONASE) 50 MCG/ACT nasal spray Place 2 sprays into both nostrils daily. 48 g 3  . omeprazole (PRILOSEC) 40 MG capsule Take 1 capsule (40 mg total) by mouth 2 (two) times daily. 180 capsule 3  . rosuvastatin (CRESTOR) 20 MG tablet Take 1 tablet (20 mg total) by mouth daily. 90 tablet 3   Current Facility-Administered Medications on File Prior to Visit  Medication Dose Route Frequency Provider Last Rate Last Dose  . 0.9 %  sodium chloride infusion  500 mL Intravenous Once Ladene Artist, MD       Review of Systems  Constitutional: Negative for other unusual diaphoresis or sweats HENT: Negative for ear discharge or swelling Eyes: Negative for other worsening visual disturbances Respiratory: Negative for stridor or other swelling  Gastrointestinal: Negative for worsening distension or other blood Genitourinary: Negative for retention or other urinary change Musculoskeletal: Negative for other MSK pain or swelling Skin: Negative for color change or other new lesions Neurological: Negative  for worsening tremors and other numbness  Psychiatric/Behavioral: Negative for worsening agitation or other fatigue All other system neg per pt    Objective:   Physical Exam BP 128/76   Pulse 70   Temp 98.3 F (36.8 C) (Oral)   Ht 5\' 5"  (1.651 m)   Wt 168 lb (76.2 kg)   SpO2 96%   BMI 27.96 kg/m  VS noted,  Constitutional: Pt appears in NAD HENT: Head: NCAT.  Right Ear: External ear normal.  Left Ear: External ear normal.  Eyes: . Pupils are equal, round, and reactive to light. Conjunctivae  and EOM are normal Nose: without d/c or deformity Neck: Neck supple. Gross normal ROM Cardiovascular: Normal rate and regular rhythm.  Does have point tenderness to right immediate parasternal area T3 dermatomal without skin change or rash Pulmonary/Chest: Effort normal and breath sounds without rales or wheezing.  Abd:  Soft, NT, ND, + BS, no organomegaly Neurological: Pt is alert. At baseline orientation, motor grossly intact Skin: Skin is warm. No rashes, other new lesions, no LE edema Psychiatric: Pt behavior is normal without agitation , not depressed affect No other exam findings  ECG I have personally interpreted today NSR, PAC, no ischemic changes     Assessment & Plan:

## 2017-10-18 NOTE — Patient Instructions (Signed)
Please continue all other medications as before, and refills have been done if requested.  Please have the pharmacy call with any other refills you may need.  Please continue your efforts at being more active, low cholesterol diet, and weight control.  You are otherwise up to date with prevention measures today.  Please keep your appointments with your specialists as you may have planned  Please go to the XRAY Department in the Basement (go straight as you get off the elevator) for the x-ray testing  You will be contacted by phone if any changes need to be made immediately.  Otherwise, you will receive a letter about your results with an explanation, but please check with MyChart first.  Please remember to sign up for MyChart if you have not done so, as this will be important to you in the future with finding out test results, communicating by private email, and scheduling acute appointments online when needed. 

## 2018-04-29 ENCOUNTER — Encounter: Payer: Medicare Other | Admitting: Internal Medicine

## 2018-04-30 ENCOUNTER — Encounter: Payer: Self-pay | Admitting: Internal Medicine

## 2018-04-30 ENCOUNTER — Other Ambulatory Visit (INDEPENDENT_AMBULATORY_CARE_PROVIDER_SITE_OTHER): Payer: Medicare Other

## 2018-04-30 ENCOUNTER — Ambulatory Visit (INDEPENDENT_AMBULATORY_CARE_PROVIDER_SITE_OTHER): Payer: Medicare Other | Admitting: Internal Medicine

## 2018-04-30 VITALS — BP 126/78 | HR 69 | Temp 98.6°F | Ht 65.0 in | Wt 168.0 lb

## 2018-04-30 DIAGNOSIS — Z125 Encounter for screening for malignant neoplasm of prostate: Secondary | ICD-10-CM | POA: Diagnosis not present

## 2018-04-30 DIAGNOSIS — Z23 Encounter for immunization: Secondary | ICD-10-CM

## 2018-04-30 DIAGNOSIS — Z0000 Encounter for general adult medical examination without abnormal findings: Secondary | ICD-10-CM

## 2018-04-30 DIAGNOSIS — R739 Hyperglycemia, unspecified: Secondary | ICD-10-CM | POA: Insufficient documentation

## 2018-04-30 DIAGNOSIS — R0600 Dyspnea, unspecified: Secondary | ICD-10-CM | POA: Diagnosis not present

## 2018-04-30 LAB — CBC WITH DIFFERENTIAL/PLATELET
BASOS PCT: 0.8 % (ref 0.0–3.0)
Basophils Absolute: 0.1 10*3/uL (ref 0.0–0.1)
EOS PCT: 6 % — AB (ref 0.0–5.0)
Eosinophils Absolute: 0.5 10*3/uL (ref 0.0–0.7)
HEMATOCRIT: 46.5 % (ref 39.0–52.0)
HEMOGLOBIN: 15.8 g/dL (ref 13.0–17.0)
Lymphocytes Relative: 16.8 % (ref 12.0–46.0)
Lymphs Abs: 1.5 10*3/uL (ref 0.7–4.0)
MCHC: 34 g/dL (ref 30.0–36.0)
MCV: 92.6 fl (ref 78.0–100.0)
MONOS PCT: 8.9 % (ref 3.0–12.0)
Monocytes Absolute: 0.8 10*3/uL (ref 0.1–1.0)
Neutro Abs: 6 10*3/uL (ref 1.4–7.7)
Neutrophils Relative %: 67.5 % (ref 43.0–77.0)
Platelets: 236 10*3/uL (ref 150.0–400.0)
RBC: 5.02 Mil/uL (ref 4.22–5.81)
RDW: 13.1 % (ref 11.5–15.5)
WBC: 8.9 10*3/uL (ref 4.0–10.5)

## 2018-04-30 LAB — URINALYSIS, ROUTINE W REFLEX MICROSCOPIC
Bilirubin Urine: NEGATIVE
Hgb urine dipstick: NEGATIVE
KETONES UR: NEGATIVE
Leukocytes, UA: NEGATIVE
Nitrite: NEGATIVE
RBC / HPF: NONE SEEN (ref 0–?)
SPECIFIC GRAVITY, URINE: 1.01 (ref 1.000–1.030)
TOTAL PROTEIN, URINE-UPE24: NEGATIVE
URINE GLUCOSE: NEGATIVE
UROBILINOGEN UA: 0.2 (ref 0.0–1.0)
WBC UA: NONE SEEN (ref 0–?)
pH: 6 (ref 5.0–8.0)

## 2018-04-30 LAB — PSA: PSA: 1.97 ng/mL (ref 0.10–4.00)

## 2018-04-30 LAB — LIPID PANEL
CHOLESTEROL: 126 mg/dL (ref 0–200)
HDL: 34.2 mg/dL — AB (ref >39.00)
NonHDL: 92.24
Total CHOL/HDL Ratio: 4
Triglycerides: 207 mg/dL — ABNORMAL HIGH (ref 0.0–149.0)
VLDL: 41.4 mg/dL — AB (ref 0.0–40.0)

## 2018-04-30 LAB — BASIC METABOLIC PANEL
BUN: 15 mg/dL (ref 6–23)
CALCIUM: 9.7 mg/dL (ref 8.4–10.5)
CO2: 31 mEq/L (ref 19–32)
Chloride: 102 mEq/L (ref 96–112)
Creatinine, Ser: 1.12 mg/dL (ref 0.40–1.50)
GFR: 67.71 mL/min (ref >60.00)
Glucose, Bld: 96 mg/dL (ref 70–99)
POTASSIUM: 4.7 meq/L (ref 3.5–5.1)
SODIUM: 139 meq/L (ref 135–145)

## 2018-04-30 LAB — TSH: TSH: 1.49 u[IU]/mL (ref 0.35–4.50)

## 2018-04-30 LAB — LDL CHOLESTEROL, DIRECT: Direct LDL: 72 mg/dL

## 2018-04-30 LAB — HEPATIC FUNCTION PANEL
ALBUMIN: 4.7 g/dL (ref 3.5–5.2)
ALT: 31 U/L (ref 0–53)
AST: 27 U/L (ref 0–37)
Alkaline Phosphatase: 45 U/L (ref 39–117)
Bilirubin, Direct: 0.1 mg/dL (ref 0.0–0.3)
TOTAL PROTEIN: 7.4 g/dL (ref 6.0–8.3)
Total Bilirubin: 0.6 mg/dL (ref 0.2–1.2)

## 2018-04-30 LAB — HEMOGLOBIN A1C: Hgb A1c MFr Bld: 6.2 % (ref 4.6–6.5)

## 2018-04-30 MED ORDER — OMEPRAZOLE 40 MG PO CPDR: 40.0000 mg | DELAYED_RELEASE_CAPSULE | Freq: Two times a day (BID) | ORAL | 3 refills | Status: DC

## 2018-04-30 MED ORDER — FLUTICASONE PROPIONATE 50 MCG/ACT NA SUSP: 2.0000 | Freq: Every day | NASAL | 3 refills | Status: DC

## 2018-04-30 MED ORDER — ROSUVASTATIN CALCIUM 20 MG PO TABS: 20.0000 mg | ORAL_TABLET | Freq: Every day | ORAL | 3 refills | Status: DC

## 2018-04-30 NOTE — Patient Instructions (Addendum)
You had the flu shot today  Please continue all other medications as before, and refills have been done if requested.  Please have the pharmacy call with any other refills you may need.  Please continue your efforts at being more active, low cholesterol diet, and weight control.  You are otherwise up to date with prevention measures today.  Please keep your appointments with your specialists as you may have planned  You will be contacted regarding the referral for: Echocardiogram  Please go to the LAB in the Basement (turn left off the elevator) for the tests to be done today  You will be contacted by phone if any changes need to be made immediately.  Otherwise, you will receive a letter about your results with an explanation, but please check with MyChart first.  Please remember to sign up for MyChart if you have not done so, as this will be important to you in the future with finding out test results, communicating by private email, and scheduling acute appointments online when needed.  Please return in 1 year for your yearly visit, or sooner if needed, with Lab testing done 3-5 days before

## 2018-04-30 NOTE — Progress Notes (Signed)
Subjective:    Patient ID: Brian Branch, male    DOB: 1941/01/20, 77 y.o.   MRN: [id number]  HPI  Here for wellness and f/u;  Overall doing ok;  Pt denies Chest pain, wheezing, orthopnea, PND, worsening LE edema, palpitations, dizziness or syncope, but has had intermittent DOE for unclear reasons,  Mar 2019 cxr with [facility]. .  Pt denies neurological change such as new headache, facial or extremity weakness.  Pt denies polydipsia, polyuria, or low sugar symptoms. Pt states overall good compliance with treatment and medications, good tolerability, and has been trying to follow appropriate diet.  Pt denies worsening depressive symptoms, suicidal ideation or panic. No fever, night sweats, wt loss, loss of appetite, or other constitutional symptoms.  Pt states good ability with ADL's, has low fall risk, home safety reviewed and adequate, no other significant changes in hearing or vision, and only occasionally active with exercise.    Past Medical History:  Diagnosis Date  . ALLERGIC RHINITIS 02/15/2007  . Allergy   . BENIGN PROSTATIC HYPERTROPHY 02/15/2007  . CAD (coronary artery disease)   . COLONIC POLYPS, HX OF 08/29/2007  . CORONARY ARTERY DISEASE 02/15/2007  . 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 DEPRESSION 08/29/2007   pt denies having depression  . ED (erectile dysfunction)   . FATIGUE 08/29/2007  . GERD (gastroesophageal reflux disease)   . Heart attack ([place])    [place]  . HYPERLIPIDEMIA 02/15/2007  . INSOMNIA-SLEEP DISORDER-UNSPEC 03/09/2010  . KNEE PAIN, LEFT 08/29/2007  . 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 SHINGLES, 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  . TOBACCO ABUSE 03/09/2010  . Unspecified eustachian tube disorder 03/09/2010   Past Surgical History:  Procedure Laterality Date  . APPENDECTOMY  1951  . COLONOSCOPY    . CORONARY ARTERY BYPASS GRAFT  1991  . POLYPECTOMY    . TONSILLECTOMY AND ADENOIDECTOMY      reports that he quit smoking about 9 years ago. He has never  used smokeless tobacco. He reports that he drinks about 2.0 standard drinks of alcohol per week. He reports that he does not use drugs. family history includes Arthritis in his mother; Heart disease in his father. Allergies  Allergen Reactions  . Levitra [Vardenafil Hydrochloride]     headache  . Lincomycin Hives  . Penicillins     REACTION: Hives  . Tadalafil     REACTION: headache  . Triazolam     REACTION: hallucinations   Current Outpatient Medications on File Prior to Visit  Medication Sig Dispense Refill  . aspirin (ASPIRIN LOW DOSE) 81 MG EC tablet TAKE 1 TABLET DAILY, SWALLOW WHOLE 90 tablet 3  . [place]-Glucosamine-Vit D (HM GLUCOSAMINE & VITAMIN D3) TABS Take 1 tablet by mouth 2 (two) times daily.    . fluticasone (FLONASE) 50 MCG/ACT nasal spray Place 2 sprays into both nostrils daily. 48 g 3  . omeprazole (PRILOSEC) 40 MG capsule Take 1 capsule (40 mg total) by mouth 2 (two) times daily. 180 capsule 3  . rosuvastatin (CRESTOR) 20 MG tablet Take 1 tablet (20 mg total) by mouth daily. 90 tablet 3   No current facility-administered medications on file prior to visit.    Review of Systems Constitutional: Negative for other unusual diaphoresis, sweats, appetite or weight changes HENT: Negative for other worsening hearing loss, ear pain, facial swelling, mouth sores or neck stiffness.   Eyes: Negative for other worsening pain, redness or other visual disturbance.  Respiratory: Negative for other  stridor or swelling Cardiovascular: Negative for other palpitations or other chest pain  Gastrointestinal: Negative for worsening diarrhea or loose stools, blood in stool, distention or other pain Genitourinary: Negative for hematuria, flank pain or other change in urine volume.  Musculoskeletal: Negative for myalgias or other joint swelling.  Skin: Negative for other color change, or other wound or worsening drainage.  Neurological: Negative for other syncope or  numbness. Hematological: Negative for other adenopathy or swelling Psychiatric/Behavioral: Negative for hallucinations, other worsening agitation, SI, self-injury, or new decreased concentration All other system neg per pt    Objective:   Physical Exam BP 126/78   Pulse 69   Temp 98.6 F (37 C) (Oral)   Ht 5\' 5"  (1.651 m)   Wt 168 lb (76.2 kg)   SpO2 95%   BMI 27.96 kg/m  VS noted,  Constitutional: Pt is oriented to person, place, and time. Appears well-developed and well-nourished, in no significant distress and comfortable Head: Normocephalic and atraumatic  Eyes: Conjunctivae and EOM are normal. Pupils are equal, round, and reactive to light Right Ear: External ear normal without discharge Left Ear: External ear normal without discharge Nose: Nose without discharge or deformity Mouth/Throat: Oropharynx is without other ulcerations and moist  Neck: Normal range of motion. Neck supple. No JVD present. No tracheal deviation present or significant neck LA or mass Cardiovascular: Normal rate, regular rhythm, normal heart sounds and intact distal pulses.   Pulmonary/Chest: WOB normal and breath sounds without rales or wheezing  Abdominal: Soft. Bowel sounds are normal. NT. No HSM  Musculoskeletal: Normal range of motion. Exhibits no edema Lymphadenopathy: Has no other cervical adenopathy.  Neurological: Pt is alert and oriented to person, place, and time. Pt has normal reflexes. No cranial nerve deficit. Motor grossly intact, Gait intact Skin: Skin is warm and dry. No rash noted or new ulcerations Psychiatric:  Has normal mood and affect. Behavior is normal without agitation No other exam findings Lab Results  Component Value Date   WBC 10.8 (H) 04/26/2017   HGB 15.8 04/26/2017   HCT 46.9 04/26/2017   PLT 250.0 04/26/2017   GLUCOSE 108 (H) 04/26/2017   CHOL 128 04/26/2017   TRIG 161.0 (H) 04/26/2017   HDL 37.10 (L) 04/26/2017   LDLDIRECT 79.0 04/09/2012   LDLCALC 59  04/26/2017   ALT 25 04/26/2017   AST 23 04/26/2017   NA 137 04/26/2017   K 4.4 04/26/2017   CL 100 04/26/2017   CREATININE 1.07 04/26/2017   BUN 20 04/26/2017   CO2 27 04/26/2017   TSH 0.95 04/26/2017   PSA 1.98 04/26/2017   HGBA1C 6.0 03/08/2009       Assessment & Plan:

## 2018-04-30 NOTE — Assessment & Plan Note (Signed)
stable overall by history and exam, recent data reviewed with pt, and pt to continue medical treatment as before,  to f/u any worsening symptoms or concerns  

## 2018-04-30 NOTE — Assessment & Plan Note (Signed)

## 2018-04-30 NOTE — Assessment & Plan Note (Signed)
Also for echo

## 2018-05-01 ENCOUNTER — Other Ambulatory Visit: Payer: Self-pay

## 2018-05-01 ENCOUNTER — Ambulatory Visit (HOSPITAL_COMMUNITY): Payer: Medicare Other | Attending: Internal Medicine

## 2018-05-01 DIAGNOSIS — R0600 Dyspnea, unspecified: Secondary | ICD-10-CM | POA: Insufficient documentation

## 2018-05-28 ENCOUNTER — Other Ambulatory Visit: Payer: Self-pay | Admitting: Internal Medicine

## 2018-11-05 NOTE — Progress Notes (Addendum)
Subjective:   Brian Branch is a 78 y.o. male who presents for an Initial Medicare Annual Wellness Visit.  I connected with patient 11/06/18 at  2:00 PM EDT by a video enabled telemedicine application and verified that I am speaking with the correct person using two identifiers. Patient stated full name and DOB. Patient gave permission to continue with virtual visit. Patient's location was at home and Nurse's location was at [place] office.   Review of Systems  No ROS.  Medicare Wellness Visit. Additional risk factors are reflected in the social history.  Cardiac Risk Factors include: advanced age (>48men, >37 women);dyslipidemia;male gender Sleep patterns: feels rested on waking, gets up 0-1 times nightly to void and sleeps 6 hours nightly.    Home Safety/Smoke Alarms: Feels safe in home. Smoke alarms in place.  Living environment; residence and Firearm Safety: 1-story house/ trailer. Lives with wife, no needs for DME, good support system Seat Belt Safety/Bike Helmet: Wears seat belt.   PSA-  Lab Results  Component Value Date   PSA 1.97 04/30/2018   PSA 1.98 04/26/2017   PSA 1.18 04/25/2016      Objective:    Today's Vitals   11/06/18 1434  PainSc: 1    There is no height or weight on file to calculate BMI.  Advanced Directives 11/06/2018  Does Patient Have a Medical Advance Directive? Yes  Type of Paramedic of [place];Living will  Copy of [place] in Chart? No - copy requested    Current Medications (verified) Outpatient Encounter Medications as of 11/06/2018  Medication Sig   aspirin (ASPIRIN LOW DOSE) 81 MG EC tablet TAKE 1 TABLET DAILY SWALLOW WHOLE   Boswellia-Glucosamine-Vit D (HM GLUCOSAMINE & VITAMIN D3) TABS Take 1 tablet by mouth 2 (two) times daily.   fluticasone (FLONASE) 50 MCG/ACT nasal spray Place 2 sprays into both nostrils daily.   omeprazole (PRILOSEC) 40 MG capsule Take 1 capsule (40 mg total) by  mouth 2 (two) times daily.   rosuvastatin (CRESTOR) 20 MG tablet Take 1 tablet (20 mg total) by mouth daily.   No facility-administered encounter medications on file as of 11/06/2018.     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   DEPRESSION 08/29/2007   pt denies having depression   ED (erectile dysfunction)    FATIGUE 08/29/2007   GERD (gastroesophageal reflux disease)    Heart attack ([place])    [place] 02/15/2007   INSOMNIA-SLEEP DISORDER-UNSPEC 03/09/2010   KNEE PAIN, LEFT 08/29/2007   MYOCARDIAL INFARCTION, 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   SHINGLES, 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   TOBACCO ABUSE 03/09/2010   Unspecified eustachian tube disorder 03/09/2010   Past Surgical History:  Procedure Laterality Date   APPENDECTOMY  1951   COLONOSCOPY     CORONARY ARTERY BYPASS GRAFT  1991   POLYPECTOMY     TONSILLECTOMY AND ADENOIDECTOMY     Family History  Problem Relation Age of Onset   Arthritis Mother        Rhuematoid   Heart disease Father    Colon cancer Neg Hx    Esophageal cancer Neg Hx  Rectal cancer Neg Hx    Stomach cancer Neg Hx    Pancreatic cancer Neg Hx    Prostate cancer Neg Hx    Colon polyps Neg Hx    Social History   Socioeconomic History   Marital status: Married    Spouse name: Not on file   Number of children: 1   Years of education: Not on file   Highest education level: Not on file  Occupational History   Not on file  Social Needs   Financial resource strain: Not hard at all   Food insecurity:    Worry: Never true    Inability: Never true   Transportation  needs:    Medical: No    Non-medical: No  Tobacco Use   Smoking status: Former Smoker    Last attempt to quit: 07/23/2008    Years since quitting: 10.2   Smokeless tobacco: Never Used  Substance and Sexual Activity   Alcohol use: Yes    Alcohol/week: 2.0 standard drinks    Types: 2 Cans of beer per week    Comment: occassionally I will drink about 2 beers   Drug use: No   Sexual activity: Not Currently  Lifestyle   Physical activity:    Days per week: 0 days    Minutes per session: 0 min   Stress: Not at all  Relationships   Social connections:    Talks on phone: More than three times a week    Gets together: More than three times a week    Attends religious service: 1 to 4 times per year    Active member of club or organization: Not on file    Attends meetings of clubs or organizations: Not on file    Relationship status: Married  Other Topics Concern   Not on file  Social History Narrative   Not on file   Tobacco Counseling Counseling given: Not Answered  Activities of Daily Living In your present state of health, do you have any difficulty performing the following activities: 11/06/2018  Hearing? N  Vision? N  Difficulty concentrating or making decisions? N  Walking or climbing stairs? N  Dressing or bathing? N  Doing errands, shopping? N  Preparing Food and eating ? N  Using the Toilet? N  In the past six months, have you accidently leaked urine? N  Do you have problems with loss of bowel control? N  Managing your Medications? N  Managing your Finances? N  Some recent data might be hidden     Immunizations and Health Maintenance Immunization History  Administered Date(s) Administered   Influenza Split 04/13/2011, 04/09/2012   Influenza Whole 04/12/2010   Influenza, High Dose Seasonal PF 04/26/2017, 04/30/2018   Influenza,inj,Quad PF,6+ Mos 04/10/2013, 04/16/2014, 04/22/2015, 04/25/2016   Pneumococcal Conjugate-13 04/30/2014   Pneumococcal  Polysaccharide-23 03/08/2009   Td 03/08/2009   Zoster 04/09/2012   There are no preventive care reminders to display for this patient.  Patient Care Team: Biagio Borg, MD as PCP - General Lyndal Pulley, DO as Consulting Physician (Family Medicine) Ladene Artist, MD as Consulting Physician (Gastroenterology)  Indicate any recent Medical Services you may have received from other than [facility] providers in the past year (date may be approximate).    Assessment:   This is a routine wellness examination for [place]. Physical assessment deferred to PCP.  Hearing/Vision screen Hearing Screening Comments: Able to hear conversational tones w/o difficulty. No issues reported.  Passed whisper test Vision  Screening Comments: appointment yearly   Dietary issues and exercise activities discussed: Current Exercise Habits: The patient does not participate in regular exercise at present, Exercise limited by: orthopedic condition(s) States he is inspired to start to go to [employer].  Diet (meal preparation, eat out, water intake, caffeinated beverages, dairy products, fruits and vegetables): in general, a "healthy" diet  , well balanced   Encouraged patient to increase daily water and healthy fluid intake. Reviewed heart healthy diet.   Goals      Patient Stated    patient  (pt-stated)     Stay as healthy and as independent as possible. I want to look into participating in [employer].      Depression Screen PHQ 2/9 Scores 11/06/2018 04/30/2018 04/26/2017 04/25/2016  PHQ - 2 Score 0 0 0 0  PHQ- 9 Score 0 0 - -    Fall Risk Fall Risk  11/06/2018 04/30/2018 04/26/2017 04/25/2016 04/22/2015  Falls in the past year? 0 No No No No  Number falls in past yr: 0 - - - -   Cognitive Function:       Ad8 score reviewed for issues:  Issues making decisions: no  Less interest in hobbies / activities: no  Repeats questions, stories (family complaining): no  Trouble using ordinary  gadgets (microwave, computer, phone):no  Forgets the month or year: no  Mismanaging finances: no  Remembering appts: no  Daily problems with thinking and/or memory: no Ad8 score is= 0 States he forgets names and words at times. Screening Tests Health Maintenance  Topic Date Due   INFLUENZA VACCINE  02/21/2019   TETANUS/TDAP  03/09/2019   COLONOSCOPY  06/27/2020   PNA vac Low Risk Adult  Completed      Plan:    Reviewed health maintenance screenings with patient today and relevant education, vaccines, and/or referrals were provided.   Continue to eat heart healthy diet (full of fruits, vegetables, whole grains, lean protein, water--limit salt, fat, and sugar intake) and increase physical activity as tolerated.  Continue doing brain stimulating activities (puzzles, reading, adult coloring books, staying active) to keep memory sharp.   I have personally reviewed and noted the following in the patients chart:    Medical and social history  Use of alcohol, tobacco or illicit drugs   Current medications and supplements  Functional ability and status  Nutritional status  Physical activity  Advanced directives  List of other physicians  Vitals  Screenings to include cognitive, depression, and falls  Referrals and appointments  In addition, I have reviewed and discussed with patient certain preventive protocols, quality metrics, and best practice recommendations. A written personalized care plan for preventive services as well as general preventive health recommendations were provided to patient.     Michiel Cowboy, RN   11/06/2018    Medical screening examination/treatment/procedure(s) were performed by non-physician practitioner and as supervising physician I was immediately available for consultation/collaboration. I agree with above. [name], MD

## 2018-11-06 ENCOUNTER — Ambulatory Visit (INDEPENDENT_AMBULATORY_CARE_PROVIDER_SITE_OTHER): Payer: Medicare Other | Admitting: *Deleted

## 2018-11-06 DIAGNOSIS — Z0000 Encounter for general adult medical examination without abnormal findings: Secondary | ICD-10-CM

## 2018-11-06 NOTE — Patient Instructions (Signed)
Continue doing brain stimulating activities (puzzles, reading, adult coloring books, staying active) to keep memory sharp.   Continue to eat heart healthy diet (full of fruits, vegetables, whole grains, lean protein, water--limit salt, fat, and sugar intake) and increase physical activity as tolerated.   Brian Branch , Thank you for taking time to come for your Medicare Wellness Visit. I appreciate your ongoing commitment to your health goals. Please review the following plan we discussed and let me know if I can assist you in the future.   These are the goals we discussed: Goals      Patient Stated    patient  (pt-stated)     Stay as healthy and as independent as possible. I want to look into participating in [employer].       This is a list of the screening recommended for you and due dates:  Health Maintenance  Topic Date Due   Flu Shot  02/21/2019   Tetanus Vaccine  03/09/2019   Colon Cancer Screening  06/27/2020   Pneumonia vaccines  Completed    Preventive Care 77 Years and Older, Male Preventive care refers to lifestyle choices and visits with your health care provider that can promote health and wellness. What does preventive care include?   A yearly physical exam. This is also called an annual well check.  Dental exams once or twice a year.  Routine eye exams. Ask your health care provider how often you should have your eyes checked.  Personal lifestyle choices, including: ? Daily care of your teeth and gums. ? Regular physical activity. ? Eating a healthy diet. ? Avoiding tobacco and drug use. ? Limiting alcohol use. ? Practicing safe sex. ? Taking low doses of aspirin every day. ? Taking vitamin and mineral supplements as recommended by your health care provider. What happens during an annual well check? The services and screenings done by your health care provider during your annual well check will depend on your age, overall health, lifestyle risk  factors, and family history of disease. Counseling Your health care provider may ask you questions about your:  Alcohol use.  Tobacco use.  Drug use.  Emotional well-being.  Home and relationship well-being.  Sexual activity.  Eating habits.  History of falls.  Memory and ability to understand (cognition).  Work and work Statistician. Screening You may have the following tests or measurements:  Height, weight, and BMI.  Blood pressure.  Lipid and cholesterol levels. These may be checked every 5 years, or more frequently if you are over 66 years old.  Skin check.  Lung cancer screening. You may have this screening every year starting at age 77 if you have a 30-pack-year history of smoking and currently smoke or have quit within the past 15 years.  Colorectal cancer screening. All adults should have this screening starting at age 77 and continuing until age 66. You will have tests every 1-10 years, depending on your results and the type of screening test. People at increased risk should start screening at an earlier age. Screening tests may include: ? Guaiac-based fecal occult blood testing. ? Fecal immunochemical test (FIT). ? Stool DNA test. ? Virtual colonoscopy. ? Sigmoidoscopy. During this test, a flexible tube with a tiny camera (sigmoidoscope) is used to examine your rectum and lower colon. The sigmoidoscope is inserted through your anus into your rectum and lower colon. ? Colonoscopy. During this test, a long, thin, flexible tube with a tiny camera (colonoscope) is used to examine  your entire colon and rectum.  Prostate cancer screening. Recommendations will vary depending on your family history and other risks.  Hepatitis C blood test.  Hepatitis B blood test.  Sexually transmitted disease (STD) testing.  Diabetes screening. This is done by checking your blood sugar (glucose) after you have not eaten for a while (fasting). You may have this done every 1-3  years.  Abdominal aortic aneurysm (AAA) screening. You may need this if you are a current or former smoker.  Osteoporosis. You may be screened starting at age 51 if you are at high risk. Talk with your health care provider about your test results, treatment options, and if necessary, the need for more tests. Vaccines Your health care provider may recommend certain vaccines, such as:  Influenza vaccine. This is recommended every year.  Tetanus, diphtheria, and acellular pertussis (Tdap, Td) vaccine. You may need a Td booster every 10 years.  Varicella vaccine. You may need this if you have not been vaccinated.  Zoster vaccine. You may need this after age 77.  Measles, mumps, and rubella (MMR) vaccine. You may need at least one dose of MMR if you were born in 1957 or later. You may also need a second dose.  Pneumococcal 13-valent conjugate (PCV13) vaccine. One dose is recommended after age 20.  Pneumococcal polysaccharide (PPSV23) vaccine. One dose is recommended after age 77.  Meningococcal vaccine. You may need this if you have certain conditions.  Hepatitis A vaccine. You may need this if you have certain conditions or if you travel or work in places where you may be exposed to hepatitis A.  Hepatitis B vaccine. You may need this if you have certain conditions or if you travel or work in places where you may be exposed to hepatitis B.  Haemophilus influenzae type b (Hib) vaccine. You may need this if you have certain risk factors. Talk to your health care provider about which screenings and vaccines you need and how often you need them. This information is not intended to replace advice given to you by your health care provider. Make sure you discuss any questions you have with your health care provider. Document Released: 08/05/2015 Document Revised: 08/29/2017 Document Reviewed: 05/10/2015 Elsevier Interactive Patient Education  2019 [employer].

## 2019-02-16 ENCOUNTER — Other Ambulatory Visit: Payer: Self-pay | Admitting: Internal Medicine

## 2019-05-06 ENCOUNTER — Ambulatory Visit (INDEPENDENT_AMBULATORY_CARE_PROVIDER_SITE_OTHER): Payer: Medicare Other | Admitting: Internal Medicine

## 2019-05-06 ENCOUNTER — Other Ambulatory Visit: Payer: Self-pay

## 2019-05-06 ENCOUNTER — Other Ambulatory Visit (INDEPENDENT_AMBULATORY_CARE_PROVIDER_SITE_OTHER): Payer: Medicare Other

## 2019-05-06 ENCOUNTER — Encounter: Payer: Self-pay | Admitting: Internal Medicine

## 2019-05-06 VITALS — BP 142/88 | Temp 97.7°F | Ht 65.0 in | Wt 166.0 lb

## 2019-05-06 DIAGNOSIS — R739 Hyperglycemia, unspecified: Secondary | ICD-10-CM

## 2019-05-06 DIAGNOSIS — E611 Iron deficiency: Secondary | ICD-10-CM | POA: Diagnosis not present

## 2019-05-06 DIAGNOSIS — F32A Depression, unspecified: Secondary | ICD-10-CM

## 2019-05-06 DIAGNOSIS — Z0001 Encounter for general adult medical examination with abnormal findings: Secondary | ICD-10-CM

## 2019-05-06 DIAGNOSIS — R350 Frequency of micturition: Secondary | ICD-10-CM

## 2019-05-06 DIAGNOSIS — N401 Enlarged prostate with lower urinary tract symptoms: Secondary | ICD-10-CM

## 2019-05-06 DIAGNOSIS — E559 Vitamin D deficiency, unspecified: Secondary | ICD-10-CM

## 2019-05-06 DIAGNOSIS — F329 Major depressive disorder, single episode, unspecified: Secondary | ICD-10-CM

## 2019-05-06 DIAGNOSIS — E785 Hyperlipidemia, unspecified: Secondary | ICD-10-CM

## 2019-05-06 DIAGNOSIS — E538 Deficiency of other specified B group vitamins: Secondary | ICD-10-CM

## 2019-05-06 DIAGNOSIS — I493 Ventricular premature depolarization: Secondary | ICD-10-CM

## 2019-05-06 DIAGNOSIS — Z23 Encounter for immunization: Secondary | ICD-10-CM

## 2019-05-06 LAB — URINALYSIS, ROUTINE W REFLEX MICROSCOPIC
Bilirubin Urine: NEGATIVE
Hgb urine dipstick: NEGATIVE
Ketones, ur: NEGATIVE
Leukocytes,Ua: NEGATIVE
Nitrite: NEGATIVE
RBC / HPF: NONE SEEN (ref 0–?)
Specific Gravity, Urine: <=1.005 — AB (ref 1.000–1.030)
Total Protein, Urine: NEGATIVE
Urine Glucose: NEGATIVE
Urobilinogen, UA: 0.2 (ref 0.0–1.0)
WBC, UA: NONE SEEN (ref 0–?)
pH: 6.5 (ref 5.0–8.0)

## 2019-05-06 LAB — LIPID PANEL
Cholesterol: 127 mg/dL (ref 0–200)
HDL: 37 mg/dL — ABNORMAL LOW (ref >39.00)
LDL Cholesterol: 54 mg/dL (ref 0–99)
NonHDL: 89.93
Total CHOL/HDL Ratio: 3
Triglycerides: 178 mg/dL — ABNORMAL HIGH (ref 0.0–149.0)
VLDL: 35.6 mg/dL (ref 0.0–40.0)

## 2019-05-06 LAB — HEPATIC FUNCTION PANEL
ALT: 27 U/L (ref 0–53)
AST: 27 U/L (ref 0–37)
Albumin: 4.8 g/dL (ref 3.5–5.2)
Alkaline Phosphatase: 43 U/L (ref 39–117)
Bilirubin, Direct: 0.2 mg/dL (ref 0.0–0.3)
Total Bilirubin: 1 mg/dL (ref 0.2–1.2)
Total Protein: 7.4 g/dL (ref 6.0–8.3)

## 2019-05-06 LAB — IBC PANEL
Iron: 117 ug/dL (ref 42–165)
Saturation Ratios: 37.6 % (ref 20.0–50.0)
Transferrin: 222 mg/dL (ref 212.0–360.0)

## 2019-05-06 LAB — CBC WITH DIFFERENTIAL/PLATELET
Basophils Absolute: 0.1 10*3/uL (ref 0.0–0.1)
Basophils Relative: 0.6 % (ref 0.0–3.0)
Eosinophils Absolute: 0.5 10*3/uL (ref 0.0–0.7)
Eosinophils Relative: 5.3 % — ABNORMAL HIGH (ref 0.0–5.0)
HCT: 45.1 % (ref 39.0–52.0)
Hemoglobin: 15.4 g/dL (ref 13.0–17.0)
Lymphocytes Relative: 15 % (ref 12.0–46.0)
Lymphs Abs: 1.3 10*3/uL (ref 0.7–4.0)
MCHC: 34.2 g/dL (ref 30.0–36.0)
MCV: 94.1 fl (ref 78.0–100.0)
Monocytes Absolute: 0.7 10*3/uL (ref 0.1–1.0)
Monocytes Relative: 8 % (ref 3.0–12.0)
Neutro Abs: 6.2 10*3/uL (ref 1.4–7.7)
Neutrophils Relative %: 71.1 % (ref 43.0–77.0)
Platelets: 236 10*3/uL (ref 150.0–400.0)
RBC: 4.79 Mil/uL (ref 4.22–5.81)
RDW: 13.3 % (ref 11.5–15.5)
WBC: 8.7 10*3/uL (ref 4.0–10.5)

## 2019-05-06 LAB — VITAMIN B12: Vitamin B-12: 198 pg/mL — ABNORMAL LOW (ref 211–911)

## 2019-05-06 LAB — BASIC METABOLIC PANEL
BUN: 15 mg/dL (ref 6–23)
CO2: 27 mEq/L (ref 19–32)
Calcium: 9.8 mg/dL (ref 8.4–10.5)
Chloride: 102 mEq/L (ref 96–112)
Creatinine, Ser: 1.05 mg/dL (ref 0.40–1.50)
GFR: 68.44 mL/min (ref >60.00)
Glucose, Bld: 110 mg/dL — ABNORMAL HIGH (ref 70–99)
Potassium: 5 mEq/L (ref 3.5–5.1)
Sodium: 139 mEq/L (ref 135–145)

## 2019-05-06 LAB — PSA: PSA: 1.96 ng/mL (ref 0.10–4.00)

## 2019-05-06 LAB — VITAMIN D 25 HYDROXY (VIT D DEFICIENCY, FRACTURES): VITD: 31.87 ng/mL (ref 30.00–100.00)

## 2019-05-06 LAB — TSH: TSH: 1.37 u[IU]/mL (ref 0.35–4.50)

## 2019-05-06 LAB — HEMOGLOBIN A1C: Hgb A1c MFr Bld: 6.4 % (ref 4.6–6.5)

## 2019-05-06 MED ORDER — TAMSULOSIN HCL 0.4 MG PO CAPS: 0.4000 mg | ORAL_CAPSULE | Freq: Every day | ORAL | 3 refills | Status: DC

## 2019-05-06 MED ORDER — OMEPRAZOLE 40 MG PO CPDR: 40.0000 mg | DELAYED_RELEASE_CAPSULE | Freq: Two times a day (BID) | ORAL | 3 refills | Status: DC

## 2019-05-06 MED ORDER — ROSUVASTATIN CALCIUM 20 MG PO TABS: 20.0000 mg | ORAL_TABLET | Freq: Every day | ORAL | 3 refills | Status: DC

## 2019-05-06 MED ORDER — FLUTICASONE PROPIONATE 50 MCG/ACT NA SUSP: 2.0000 | Freq: Every day | NASAL | 3 refills | Status: DC

## 2019-05-06 NOTE — Progress Notes (Signed)
Subjective:    Patient ID: Brian Branch, male    DOB: June 24, 1941, 78 y.o.   MRN: [id number]  HPI  Here for wellness and f/u;  Overall doing ok;  Pt denies Chest pain, worsening SOB, DOE, wheezing, orthopnea, PND, worsening LE edema, palpitations, dizziness or syncope.  Pt denies neurological change such as new headache, facial or extremity weakness.  Pt denies polydipsia, polyuria, or low sugar symptoms. Pt states overall good compliance with treatment and medications, good tolerability, and has been trying to follow appropriate diet.  Pt denies worsening depressive symptoms, suicidal ideation or panic. No fever, night sweats, wt loss, loss of appetite, or other constitutional symptoms.  Pt states good ability with ADL's, has low fall risk, home safety reviewed and adequate, no other significant changes in hearing or vision, and only occasionally active with exercise. Wt Readings from Last 3 Encounters:  05/06/19 166 lb (75.3 kg)  04/30/18 168 lb (76.2 kg)  10/18/17 168 lb (76.2 kg)  also with 3-6 months of worsening urinary slowing and difficulty passing urine, with nocturia 1- 3 times as well.  Denies urinary symptoms such as dysuria, frequency, urgency, flank pain, hematuria or n/v, fever, chills. Past Medical History:  Diagnosis Date  . ALLERGIC RHINITIS 02/15/2007  . Allergy   . BENIGN PROSTATIC HYPERTROPHY 02/15/2007  . CAD (coronary artery disease)   . COLONIC POLYPS, HX OF 08/29/2007  . CORONARY ARTERY DISEASE 02/15/2007  . 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 DEPRESSION 08/29/2007   pt denies having depression  . ED (erectile dysfunction)   . FATIGUE 08/29/2007  . GERD (gastroesophageal reflux disease)   . Heart attack ([place])    [place]  . HYPERLIPIDEMIA 02/15/2007  . INSOMNIA-SLEEP DISORDER-UNSPEC 03/09/2010  . KNEE PAIN, LEFT 08/29/2007  . 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 SHINGLES, 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  . TOBACCO ABUSE 03/09/2010  . Unspecified eustachian  tube disorder 03/09/2010   Past Surgical History:  Procedure Laterality Date  . APPENDECTOMY  1951  . COLONOSCOPY    . CORONARY ARTERY BYPASS GRAFT  1991  . POLYPECTOMY    . TONSILLECTOMY AND ADENOIDECTOMY      reports that he quit smoking about 10 years ago. He has never used smokeless tobacco. He reports current alcohol use of about 2.0 standard drinks of alcohol per week. He reports that he does not use drugs. family history includes Arthritis in his mother; Heart disease in his father. Allergies  Allergen Reactions  . Levitra [Vardenafil Hydrochloride]     headache  . Lincomycin Hives  . Penicillins     REACTION: Hives  . Tadalafil     REACTION: headache  . Triazolam     REACTION: hallucinations   Current Outpatient Medications on File Prior to Visit  Medication Sig Dispense Refill  . aspirin (ASPIRIN LOW DOSE) 81 MG EC tablet TAKE 1 TABLET DAILY (SWALLOW WHOLE) 90 tablet 3  . Boswellia-Glucosamine-Vit D (HM GLUCOSAMINE & VITAMIN D3) TABS Take 1 tablet by mouth 2 (two) times daily.     No current facility-administered medications on file prior to visit.    Review of Systems Constitutional: Negative for other unusual diaphoresis, sweats, appetite or weight changes HENT: Negative for other worsening hearing loss, ear pain, facial swelling, mouth sores or neck stiffness.   Eyes: Negative for other worsening pain, redness or other visual disturbance.  Respiratory: Negative for other stridor or swelling Cardiovascular: Negative for other palpitations or other chest pain  Gastrointestinal: Negative for worsening diarrhea or loose stools, blood in stool, distention or other pain Genitourinary: Negative for hematuria, flank pain or other change in urine volume.  Musculoskeletal: Negative for myalgias or other joint swelling.  Skin: Negative for other color change, or other wound or worsening drainage.  Neurological: Negative for other syncope or numbness. Hematological: Negative  for other adenopathy or swelling Psychiatric/Behavioral: Negative for hallucinations, other worsening agitation, SI, self-injury, or new decreased concentration All otherwise neg per pt    Objective:   Physical Exam BP (!) 142/88   Temp 97.7 F (36.5 C) (Oral)   Ht 5\' 5"  (1.651 m)   Wt 166 lb (75.3 kg)   SpO2 98%   BMI 27.62 kg/m  VS noted,  Constitutional: Pt is oriented to person, place, and time. Appears well-developed and well-nourished, in no significant distress and comfortable Head: Normocephalic and atraumatic  Eyes: Conjunctivae and EOM are normal. Pupils are equal, round, and reactive to light Right Ear: External ear normal without discharge Left Ear: External ear normal without discharge Nose: Nose without discharge or deformity Mouth/Throat: Oropharynx is without other ulcerations and moist  Neck: Normal range of motion. Neck supple. No JVD present. No tracheal deviation present or significant neck LA or mass Cardiovascular: Normal rate, regular rhythm, normal heart sounds and intact distal pulses.   Pulmonary/Chest: WOB normal and breath sounds without rales or wheezing  Abdominal: Soft. Bowel sounds are normal. NT. No HSM  Musculoskeletal: Normal range of motion. Exhibits no edema Lymphadenopathy: Has no other cervical adenopathy.  Neurological: Pt is alert and oriented to person, place, and time. Pt has normal reflexes. No cranial nerve deficit. Motor grossly intact, Gait intact Skin: Skin is warm and dry. No rash noted or new ulcerations Psychiatric:  Has normal mood and affect. Behavior is normal without agitation All otherwise neg per pt Lab Results  Component Value Date   WBC 8.7 05/06/2019   HGB 15.4 05/06/2019   HCT 45.1 05/06/2019   PLT 236.0 05/06/2019   GLUCOSE 110 (H) 05/06/2019   CHOL 127 05/06/2019   TRIG 178.0 (H) 05/06/2019   HDL 37.00 (L) 05/06/2019   LDLDIRECT 72.0 04/30/2018   LDLCALC 54 05/06/2019   ALT 27 05/06/2019   AST 27 05/06/2019    NA 139 05/06/2019   K 5.0 05/06/2019   CL 102 05/06/2019   CREATININE 1.05 05/06/2019   BUN 15 05/06/2019   CO2 27 05/06/2019   TSH 1.37 05/06/2019   PSA 1.96 05/06/2019   HGBA1C 6.4 05/06/2019        Assessment & Plan:

## 2019-05-06 NOTE — Patient Instructions (Signed)
Please take all new medication as prescribed - the flomax for prostate  Please continue all other medications as before, and refills have been done if requested.  Please have the pharmacy call with any other refills you may need.  Please continue your efforts at being more active, low cholesterol diet, and weight control.  You are otherwise up to date with prevention measures today.  Please keep your appointments with your specialists as you may have planned  Please go to the LAB in the Basement (turn left off the elevator) for the tests to be done today  You will be contacted by phone if any changes need to be made immediately.  Otherwise, you will receive a letter about your results with an explanation, but please check with MyChart first.  Please remember to sign up for MyChart if you have not done so, as this will be important to you in the future with finding out test results, communicating by private email, and scheduling acute appointments online when needed.  Please return in 1 year for your yearly visit, or sooner if needed, with Lab testing done 3-5 days before

## 2019-05-09 ENCOUNTER — Encounter: Payer: Self-pay | Admitting: Internal Medicine

## 2019-05-09 NOTE — Assessment & Plan Note (Signed)
stable overall by history and exam, recent data reviewed with pt, and pt to continue medical treatment as before,  to f/u any worsening symptoms or concerns  

## 2019-05-09 NOTE — Assessment & Plan Note (Signed)

## 2019-05-09 NOTE — Assessment & Plan Note (Addendum)
With mild worsening symptoms, for UA with labs, also start flomax daily  In addition to the time spent performing CPE, I spent an additional 25 minutes face to face,in which greater than 50% of this time was spent in counseling and coordination of care for patient's acute illness as documented, including the differential dx, treatment, further evaluation and other management of BPH, hyperglycemia, HLD, PVCs, depression

## 2019-05-18 ENCOUNTER — Other Ambulatory Visit: Payer: Self-pay

## 2019-05-18 ENCOUNTER — Ambulatory Visit (INDEPENDENT_AMBULATORY_CARE_PROVIDER_SITE_OTHER): Payer: Medicare Other

## 2019-05-18 DIAGNOSIS — E538 Deficiency of other specified B group vitamins: Secondary | ICD-10-CM

## 2019-05-18 MED ORDER — CYANOCOBALAMIN 1000 MCG/ML IJ SOLN
1000.0000 ug | Freq: Once | INTRAMUSCULAR | Status: AC
  Administered 2019-05-18: 1000 ug via INTRAMUSCULAR

## 2019-05-18 NOTE — Progress Notes (Signed)
Medical screening examination/treatment/procedure(s) were performed by non-physician practitioner and as supervising physician I was immediately available for consultation/collaboration. I agree with above. James John, MD   

## 2019-06-17 ENCOUNTER — Ambulatory Visit (INDEPENDENT_AMBULATORY_CARE_PROVIDER_SITE_OTHER): Payer: Medicare Other

## 2019-06-17 ENCOUNTER — Other Ambulatory Visit: Payer: Self-pay

## 2019-06-17 DIAGNOSIS — E538 Deficiency of other specified B group vitamins: Secondary | ICD-10-CM

## 2019-06-17 MED ORDER — CYANOCOBALAMIN 1000 MCG/ML IJ SOLN
1000.0000 ug | Freq: Once | INTRAMUSCULAR | Status: AC
  Administered 2019-06-17: 1000 ug via INTRAMUSCULAR

## 2019-06-17 NOTE — Progress Notes (Signed)
Medical screening examination/treatment/procedure(s) were performed by non-physician practitioner and as supervising physician I was immediately available for consultation/collaboration. I agree with above. Izzak Fries, MD   

## 2019-07-20 ENCOUNTER — Ambulatory Visit: Payer: Medicare Other

## 2019-07-20 ENCOUNTER — Ambulatory Visit: Payer: Medicare Other | Attending: Internal Medicine

## 2019-07-20 DIAGNOSIS — Z20822 Contact with and (suspected) exposure to covid-19: Secondary | ICD-10-CM

## 2019-07-22 ENCOUNTER — Ambulatory Visit: Payer: Medicare Other

## 2019-07-22 LAB — NOVEL CORONAVIRUS, NAA: SARS-CoV-2, NAA: DETECTED — AB

## 2019-08-05 ENCOUNTER — Other Ambulatory Visit: Payer: Self-pay

## 2019-08-05 ENCOUNTER — Encounter: Payer: Self-pay | Admitting: Internal Medicine

## 2019-08-05 ENCOUNTER — Ambulatory Visit (INDEPENDENT_AMBULATORY_CARE_PROVIDER_SITE_OTHER): Payer: Medicare Other | Admitting: Internal Medicine

## 2019-08-05 ENCOUNTER — Ambulatory Visit: Payer: Medicare Other

## 2019-08-05 DIAGNOSIS — U071 COVID-19: Secondary | ICD-10-CM | POA: Diagnosis not present

## 2019-08-05 NOTE — Patient Instructions (Signed)
See notes

## 2019-08-05 NOTE — Assessment & Plan Note (Signed)
See notes

## 2019-08-05 NOTE — Progress Notes (Signed)
Patient ID: Brian Branch, male   DOB: 04-29-1941, 79 y.o.   MRN: [id number]  .Phone visit  Cumulative time during 7-day interval 12 min, there was not an associated office visit for this concern within a 7 day period.  Verbal consent for services obtained from patient prior to services given.  Names of all persons present for services: [name], MD, patient  Chief complaint: covid infection  History, background, results pertinent: Here to f/u after recent dx covid + dec 28 with typical URI symptoms, but still mild scant prod cough and slight decreased taste persist.  Pt denies chest pain, increased sob or doe, wheezing, orthopnea, PND, increased LE swelling, palpitations, dizziness or syncope. Pt denies new neurological symptoms such as new headache, or facial or extremity weakness or numbness   Pt denies polydipsia, polyuria.      Past Medical History:  Diagnosis Date  . ALLERGIC RHINITIS 02/15/2007  . Allergy   . BENIGN PROSTATIC HYPERTROPHY 02/15/2007  . CAD (coronary artery disease)   . COLONIC POLYPS, HX OF 08/29/2007  . CORONARY ARTERY DISEASE 02/15/2007  . 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 DEPRESSION 08/29/2007   pt denies having depression  . ED (erectile dysfunction)   . FATIGUE 08/29/2007  . GERD (gastroesophageal reflux disease)   . Heart attack ([place])    [place]  . HYPERLIPIDEMIA 02/15/2007  . INSOMNIA-SLEEP DISORDER-UNSPEC 03/09/2010  . KNEE PAIN, LEFT 08/29/2007  . 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 SHINGLES, 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  . TOBACCO ABUSE 03/09/2010  . Unspecified eustachian tube disorder 03/09/2010   No results found for this or any previous visit (from the past 48 hour(s)). Current Outpatient Medications on File Prior to Visit  Medication Sig Dispense Refill  . aspirin (ASPIRIN LOW DOSE) 81 MG EC tablet TAKE 1 TABLET DAILY (SWALLOW WHOLE) 90 tablet 3  . [place]-Glucosamine-Vit D (HM GLUCOSAMINE & VITAMIN D3) TABS Take 1 tablet by  mouth 2 (two) times daily.    . fluticasone (FLONASE) 50 MCG/ACT nasal spray Place 2 sprays into both nostrils daily. 48 g 3  . omeprazole (PRILOSEC) 40 MG capsule Take 1 capsule (40 mg total) by mouth 2 (two) times daily. 180 capsule 3  . rosuvastatin (CRESTOR) 20 MG tablet Take 1 tablet (20 mg total) by mouth daily. 90 tablet 3  . tamsulosin (FLOMAX) 0.4 MG CAPS capsule Take 1 capsule (0.4 mg total) by mouth daily. 90 capsule 3   No current facility-administered medications on file prior to visit.   Lab Results  Component Value Date   WBC 8.7 05/06/2019   HGB 15.4 05/06/2019   HCT 45.1 05/06/2019   PLT 236.0 05/06/2019   GLUCOSE 110 (H) 05/06/2019   CHOL 127 05/06/2019   TRIG 178.0 (H) 05/06/2019   HDL 37.00 (L) 05/06/2019   LDLDIRECT 72.0 04/30/2018   LDLCALC 54 05/06/2019   ALT 27 05/06/2019   AST 27 05/06/2019   NA 139 05/06/2019   K 5.0 05/06/2019   CL 102 05/06/2019   CREATININE 1.05 05/06/2019   BUN 15 05/06/2019   CO2 27 05/06/2019   TSH 1.37 05/06/2019   PSA 1.96 05/06/2019   HGBA1C 6.4 05/06/2019   A/P/next steps:   COVID infection - with mild persistent residual symptoms, for symptomatic meds and tx, pt reasusred,  to f/u any worsening symptoms or concerns  [name] MD

## 2019-08-19 ENCOUNTER — Ambulatory Visit: Payer: Medicare Other

## 2019-08-20 ENCOUNTER — Ambulatory Visit (INDEPENDENT_AMBULATORY_CARE_PROVIDER_SITE_OTHER): Payer: Medicare Other | Admitting: *Deleted

## 2019-08-20 ENCOUNTER — Other Ambulatory Visit: Payer: Self-pay

## 2019-08-20 DIAGNOSIS — E538 Deficiency of other specified B group vitamins: Secondary | ICD-10-CM | POA: Diagnosis not present

## 2019-08-20 MED ORDER — CYANOCOBALAMIN 1000 MCG/ML IJ SOLN
1000.0000 ug | Freq: Once | INTRAMUSCULAR | Status: AC
  Administered 2019-08-20: 11:00:00 1000 ug via INTRAMUSCULAR

## 2019-08-20 NOTE — Progress Notes (Signed)
Medical screening examination/treatment/procedure(s) were performed by non-physician practitioner and as supervising physician I was immediately available for consultation/collaboration. I agree with above. James John, MD   

## 2019-09-18 ENCOUNTER — Other Ambulatory Visit: Payer: Self-pay

## 2019-09-18 ENCOUNTER — Ambulatory Visit (INDEPENDENT_AMBULATORY_CARE_PROVIDER_SITE_OTHER): Payer: Medicare Other | Admitting: *Deleted

## 2019-09-18 DIAGNOSIS — E538 Deficiency of other specified B group vitamins: Secondary | ICD-10-CM

## 2019-09-18 MED ORDER — CYANOCOBALAMIN 1000 MCG/ML IJ SOLN
1000.0000 ug | Freq: Once | INTRAMUSCULAR | Status: AC
  Administered 2019-09-18: 09:00:00 1000 ug via INTRAMUSCULAR

## 2019-09-18 NOTE — Progress Notes (Signed)
Medical screening examination/treatment/procedure(s) were performed by non-physician practitioner and as supervising physician I was immediately available for consultation/collaboration. I agree with above. Adaeze Better, MD   

## 2019-10-16 ENCOUNTER — Ambulatory Visit (INDEPENDENT_AMBULATORY_CARE_PROVIDER_SITE_OTHER): Payer: Medicare Other | Admitting: *Deleted

## 2019-10-16 ENCOUNTER — Other Ambulatory Visit: Payer: Self-pay

## 2019-10-16 DIAGNOSIS — E538 Deficiency of other specified B group vitamins: Secondary | ICD-10-CM | POA: Diagnosis not present

## 2019-10-16 MED ORDER — CYANOCOBALAMIN 1000 MCG/ML IJ SOLN
1000.0000 ug | Freq: Once | INTRAMUSCULAR | Status: AC
  Administered 2019-10-16: 1000 ug via INTRAMUSCULAR

## 2019-10-16 NOTE — Progress Notes (Signed)
Pls cosign for B12 inj../lmb  

## 2019-11-11 ENCOUNTER — Other Ambulatory Visit: Payer: Self-pay

## 2019-11-11 ENCOUNTER — Ambulatory Visit (INDEPENDENT_AMBULATORY_CARE_PROVIDER_SITE_OTHER): Payer: Medicare Other

## 2019-11-11 VITALS — BP 128/70 | HR 76 | Temp 98.2°F | Resp 16 | Ht 65.0 in | Wt 164.4 lb

## 2019-11-11 DIAGNOSIS — Z0000 Encounter for general adult medical examination without abnormal findings: Secondary | ICD-10-CM

## 2019-11-11 DIAGNOSIS — E538 Deficiency of other specified B group vitamins: Secondary | ICD-10-CM

## 2019-11-11 MED ORDER — CYANOCOBALAMIN 1000 MCG/ML IJ SOLN
1000.0000 ug | Freq: Once | INTRAMUSCULAR | Status: AC
  Administered 2019-11-11: 15:00:00 1000 ug via INTRAMUSCULAR

## 2019-11-11 NOTE — Patient Instructions (Addendum)
[name] , Thank you for taking time to come for your Medicare Wellness Visit. I appreciate your ongoing commitment to your health goals. Please review the following plan we discussed and let me know if I can assist you in the future.   Screening recommendations/referrals: Colorectal Screening: 06/27/2017  Vision and Dental Exams: Recommended annual ophthalmology exams for early detection of glaucoma and other disorders of the eye Recommended annual dental exams for proper oral hygiene  Vaccinations: Influenza vaccine: 05/06/2019 Pneumococcal vaccine: completed Tdap vaccine: 03/08/2009; Due every 10 years Shingles vaccine: completed Covid vaccine: completed  Advanced directives: Advance directives discussed with you today. Please bring a copy of your POA (Power of [place]) and/or Living Will to your next appointment.  Goals: Recommend to drink at least 6-8 8oz glasses of water per day.  Recommend to exercise for at least 150 minutes per week.  Recommend to remove any items from the home that may cause slips or trips.  Recommend to decrease portion sizes by eating 3 small healthy meals and at least 2 healthy snacks per day.  Recommend to begin DASH diet as directed below.  Recommend to continue efforts to reduce smoking habits until no longer smoking. Smoking Cessation literature is attached below.  Next appointment: Please schedule your Annual Wellness Visit with your Nurse Health Advisor in one year.  Preventive Care 45 Years and Older, Male Preventive care refers to lifestyle choices and visits with your health care provider that can promote health and wellness. What does preventive care include?  A yearly physical exam. This is also called an annual well check.  Dental exams once or twice a year.  Routine eye exams. Ask your health care provider how often you should have your eyes checked.  Personal lifestyle choices, including:  Daily care of your teeth and  gums.  Regular physical activity.  Eating a healthy diet.  Avoiding tobacco and drug use.  Limiting alcohol use.  Practicing safe sex.  Taking low doses of aspirin every day if recommended by your health care provider..  Taking vitamin and mineral supplements as recommended by your health care provider. What happens during an annual well check? The services and screenings done by your health care provider during your annual well check will depend on your age, overall health, lifestyle risk factors, and family history of disease. Counseling  Your health care provider may ask you questions about your:  Alcohol use.  Tobacco use.  Drug use.  Emotional well-being.  Home and relationship well-being.  Sexual activity.  Eating habits.  History of falls.  Memory and ability to understand (cognition).  Work and work Statistician. Screening  You may have the following tests or measurements:  Height, weight, and BMI.  Blood pressure.  Lipid and cholesterol levels. These may be checked every 5 years, or more frequently if you are over 58 years old.  Skin check.  Lung cancer screening. You may have this screening every year starting at age 1 if you have a 30-pack-year history of smoking and currently smoke or have quit within the past 15 years.  Fecal occult blood test (FOBT) of the stool. You may have this test every year starting at age 41.  Flexible sigmoidoscopy or colonoscopy. You may have a sigmoidoscopy every 5 years or a colonoscopy every 10 years starting at age 90.  Prostate cancer screening. Recommendations will vary depending on your family history and other risks.  Hepatitis C blood test.  Hepatitis B blood test.  Sexually  transmitted disease (STD) testing.  Diabetes screening. This is done by checking your blood sugar (glucose) after you have not eaten for a while (fasting). You may have this done every 1-3 years.  Abdominal aortic aneurysm (AAA)  screening. You may need this if you are a current or former smoker.  Osteoporosis. You may be screened starting at age 32 if you are at high risk. Talk with your health care provider about your test results, treatment options, and if necessary, the need for more tests. Vaccines  Your health care provider may recommend certain vaccines, such as:  Influenza vaccine. This is recommended every year.  Tetanus, diphtheria, and acellular pertussis (Tdap, Td) vaccine. You may need a Td booster every 10 years.  Zoster vaccine. You may need this after age 80.  Pneumococcal 13-valent conjugate (PCV13) vaccine. One dose is recommended after age 76.  Pneumococcal polysaccharide (PPSV23) vaccine. One dose is recommended after age 59. Talk to your health care provider about which screenings and vaccines you need and how often you need them. This information is not intended to replace advice given to you by your health care provider. Make sure you discuss any questions you have with your health care provider. Document Released: 08/05/2015 Document Revised: 03/28/2016 Document Reviewed: 05/10/2015 Elsevier Interactive Patient Education  2017 [place] Prevention in the Home Falls can cause injuries. They can happen to people of all ages. There are many things you can do to make your home safe and to help prevent falls. What can I do on the outside of my home?  Regularly fix the edges of walkways and driveways and fix any cracks.  Remove anything that might make you trip as you walk through a door, such as a raised step or threshold.  Trim any bushes or trees on the path to your home.  Use bright outdoor lighting.  Clear any walking paths of anything that might make someone trip, such as rocks or tools.  Regularly check to see if handrails are loose or broken. Make sure that both sides of any steps have handrails.  Any raised decks and porches should have guardrails on the  edges.  Have any leaves, snow, or ice cleared regularly.  Use sand or salt on walking paths during winter.  Clean up any spills in your garage right away. This includes oil or grease spills. What can I do in the bathroom?  Use night lights.  Install grab bars by the toilet and in the tub and shower. Do not use towel bars as grab bars.  Use non-skid mats or decals in the tub or shower.  If you need to sit down in the shower, use a plastic, non-slip stool.  Keep the floor dry. Clean up any water that spills on the floor as soon as it happens.  Remove soap buildup in the tub or shower regularly.  Attach bath mats securely with double-sided non-slip rug tape.  Do not have throw rugs and other things on the floor that can make you trip. What can I do in the bedroom?  Use night lights.  Make sure that you have a light by your bed that is easy to reach.  Do not use any sheets or blankets that are too big for your bed. They should not hang down onto the floor.  Have a firm chair that has side arms. You can use this for support while you get dressed.  Do not have throw rugs and other things  on the floor that can make you trip. What can I do in the kitchen?  Clean up any spills right away.  Avoid walking on wet floors.  Keep items that you use a lot in easy-to-reach places.  If you need to reach something above you, use a strong step stool that has a grab bar.  Keep electrical cords out of the way.  Do not use floor polish or wax that makes floors slippery. If you must use wax, use non-skid floor wax.  Do not have throw rugs and other things on the floor that can make you trip. What can I do with my stairs?  Do not leave any items on the stairs.  Make sure that there are handrails on both sides of the stairs and use them. Fix handrails that are broken or loose. Make sure that handrails are as long as the stairways.  Check any carpeting to make sure that it is firmly  attached to the stairs. Fix any carpet that is loose or worn.  Avoid having throw rugs at the top or bottom of the stairs. If you do have throw rugs, attach them to the floor with carpet tape.  Make sure that you have a light switch at the top of the stairs and the bottom of the stairs. If you do not have them, ask someone to add them for you. What else can I do to help prevent falls?  Wear shoes that:  Do not have high heels.  Have rubber bottoms.  Are comfortable and fit you well.  Are closed at the toe. Do not wear sandals.  If you use a stepladder:  Make sure that it is fully opened. Do not climb a closed stepladder.  Make sure that both sides of the stepladder are locked into place.  Ask someone to hold it for you, if possible.  Clearly mark and make sure that you can see:  Any grab bars or handrails.  First and last steps.  Where the edge of each step is.  Use tools that help you move around (mobility aids) if they are needed. These include:  Canes.  Walkers.  Scooters.  Crutches.  Turn on the lights when you go into a dark area. Replace any light bulbs as soon as they burn out.  Set up your furniture so you have a clear path. Avoid moving your furniture around.  If any of your floors are uneven, fix them.  If there are any pets around you, be aware of where they are.  Review your medicines with your doctor. Some medicines can make you feel dizzy. This can increase your chance of falling. Ask your doctor what other things that you can do to help prevent falls. This information is not intended to replace advice given to you by your health care provider. Make sure you discuss any questions you have with your health care provider. Document Released: 05/05/2009 Document Revised: 12/15/2015 Document Reviewed: 08/13/2014 Elsevier Interactive Patient Education  2017 [employer].

## 2019-11-11 NOTE — Progress Notes (Addendum)
Subjective:   Brian Branch is a 79 y.o. male who presents for Medicare Annual/Subsequent preventive examination.  Review of Systems:  No ROS Medicare Wellness Visit Cardiac Risk Factors include: advanced age (>56men, >90 women);dyslipidemia;family history of premature cardiovascular disease;male gender  Sleep Patterns: Issues with insomnia; gets up 1 time nightly to void. Home Safety/Smoke Alarms: Feels safe in home; Smoke alarms in place. Living environment: Lives with his wife in a 2-story home; no need for DME; good support system. Seat Belt Safety/Bike Helmet: Wears seat belt.     Objective:    Vitals: BP 128/70 (BP Location: Left Arm, Patient Position: Sitting, Cuff Size: Normal)   Pulse 76   Temp 98.2 F (36.8 C)   Resp 16   Ht 5\' 5"  (1.651 m)   Wt 164 lb 6.4 oz (74.6 kg)   SpO2 96%   BMI 27.36 kg/m   Body mass index is 27.36 kg/m.  Advanced Directives 11/11/2019 11/06/2018  Does Patient Have a Medical Advance Directive? Yes Yes  Type of Paramedic of [place];Living will [place];Living will  Does patient want to make changes to medical advance directive? No - Patient declined -  Copy of [place] in Chart? No - copy requested No - copy requested    Tobacco Social History   Tobacco Use  Smoking Status Former Smoker  . Quit date: 07/23/2008  . Years since quitting: 11.3  Smokeless Tobacco Never Used     Counseling given: Not Answered   Clinical Intake:  Pre-visit preparation completed: Yes  Pain : No/denies pain Pain Score: 0-No pain     BMI - recorded: 27.4 Nutritional Status: BMI 25 -29 Overweight Nutritional Risks: None Diabetes: No  How often do you need to have someone help you when you read instructions, pamphlets, or other written materials from your doctor or pharmacy?: 1 - Never What is the last grade level you completed in school?: 12th Grade  Interpreter Needed?:  No  Information entered by :: Christy Ehrsam N. Lowell Guitar, LPN  Past Medical History:  Diagnosis Date  . ALLERGIC RHINITIS 02/15/2007  . Allergy   . BENIGN PROSTATIC HYPERTROPHY 02/15/2007  . CAD (coronary artery disease)   . COLONIC POLYPS, HX OF 08/29/2007  . CORONARY ARTERY DISEASE 02/15/2007  . 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 DEPRESSION 08/29/2007   pt denies having depression  . ED (erectile dysfunction)   . FATIGUE 08/29/2007  . GERD (gastroesophageal reflux disease)   . Heart attack ([place])    [place]  . HYPERLIPIDEMIA 02/15/2007  . INSOMNIA-SLEEP DISORDER-UNSPEC 03/09/2010  . KNEE PAIN, LEFT 08/29/2007  . 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 SHINGLES, 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  . TOBACCO ABUSE 03/09/2010  . Unspecified eustachian tube disorder 03/09/2010   Past Surgical History:  Procedure Laterality Date  . APPENDECTOMY  1951  . COLONOSCOPY    . CORONARY ARTERY BYPASS GRAFT  1991  . POLYPECTOMY    . TONSILLECTOMY AND ADENOIDECTOMY     Family History  Problem Relation Age of Onset  . Arthritis Mother        Rhuematoid  . Heart disease Father   . Colon cancer Neg Hx   . Esophageal cancer Neg Hx   . Rectal cancer Neg Hx   . Stomach cancer Neg Hx   . Pancreatic cancer Neg Hx   . Prostate cancer Neg Hx   . Colon polyps Neg Hx  Social History   Socioeconomic History  . Marital status: Married    Spouse name: Not on file  . Number of children: 1  . Years of education: Not on file  . Highest education level: Not on file  Occupational History  . Not on file  Tobacco Use  . Smoking status: Former Smoker    Quit date: 07/23/2008    Years since quitting: 11.3  . Smokeless tobacco: Never Used  Substance and Sexual Activity  . Alcohol use: Yes    Alcohol/week: 2.0 standard drinks    Types: 2 Cans of beer per week    Comment: occassionally I will drink about 2 beers  . Drug use: No  . Sexual activity: Not Currently  Other Topics Concern  . Not on file   Social History Narrative  . Not on file   Social Determinants of Health   Financial Resource Strain:   . Difficulty of Paying Living Expenses:   Food Insecurity:   . Worried About Charity fundraiser in the Last Year:   . Arboriculturist in the Last Year:   Transportation Needs:   . Film/video editor (Medical):   Marland Kitchen Lack of Transportation (Non-Medical):   Physical Activity:   . Days of Exercise per Week:   . Minutes of Exercise per Session:   Stress:   . Feeling of Stress :   Social Connections:   . Frequency of Communication with Friends and Family:   . Frequency of Social Gatherings with Friends and Family:   . Attends Religious Services:   . Active Member of Clubs or Organizations:   . Attends Archivist Meetings:   Marland Kitchen Marital Status:     Outpatient Encounter Medications as of 11/11/2019  Medication Sig  . aspirin (ASPIRIN LOW DOSE) 81 MG EC tablet TAKE 1 TABLET DAILY (SWALLOW WHOLE)  . [place]-Glucosamine-Vit D (HM GLUCOSAMINE & VITAMIN D3) TABS Take 1 tablet by mouth 2 (two) times daily.  . fluticasone (FLONASE) 50 MCG/ACT nasal spray Place 2 sprays into both nostrils daily.  Marland Kitchen omeprazole (PRILOSEC) 40 MG capsule Take 1 capsule (40 mg total) by mouth 2 (two) times daily.  . rosuvastatin (CRESTOR) 20 MG tablet Take 1 tablet (20 mg total) by mouth daily.  . tamsulosin (FLOMAX) 0.4 MG CAPS capsule Take 1 capsule (0.4 mg total) by mouth daily.  . [EXPIRED] cyanocobalamin ((VITAMIN B-12)) injection 1,000 mcg    No facility-administered encounter medications on file as of 11/11/2019.    Activities of Daily Living In your present state of health, do you have any difficulty performing the following activities: 11/11/2019  Hearing? N  Vision? N  Difficulty concentrating or making decisions? N  Walking or climbing stairs? N  Dressing or bathing? N  Doing errands, shopping? N  Preparing Food and eating ? N  Using the Toilet? N  In the past six months, have  you accidently leaked urine? N  Do you have problems with loss of bowel control? N  Managing your Medications? N  Managing your Finances? N  Housekeeping or managing your Housekeeping? N  Some recent data might be hidden    Patient Care Team: Biagio Borg, MD as PCP - General Lyndal Pulley, DO as Consulting Physician (Family Medicine) Ladene Artist, MD as Consulting Physician (Gastroenterology)   Assessment:   This is a routine wellness examination for [place].  Exercise Activities and Dietary recommendations Current Exercise Habits: The patient does not participate in  regular exercise at present(does yard work), Exercise limited by: None identified  Goals    . patient  (pt-stated)     Stay as healthy and as independent as possible. I want to look into participating in [employer].    . Patient Stated     To wake up and see the next day.       Fall Risk Fall Risk  11/11/2019 11/06/2018 04/30/2018 04/26/2017 04/25/2016  Falls in the past year? 0 0 No No No  Number falls in past yr: 0 0 - - -  Injury with Fall? 0 - - - -  Risk for fall due to : No Fall Risks - - - -  Follow up Falls evaluation completed;Education provided - - - -   Is the patient's home free of loose throw rugs in walkways, pet beds, electrical cords, etc?   yes      Grab bars in the bathroom? yes      Handrails on the stairs?   yes      Adequate lighting?   yes    Depression Screen PHQ 2/9 Scores 11/11/2019 11/06/2018 04/30/2018 04/26/2017  PHQ - 2 Score 0 0 0 0  PHQ- 9 Score - 0 0 -    Cognitive Function     6CIT Screen 11/11/2019  What Year? 0 points  What month? 0 points  What time? 0 points  Count back from 20 0 points  Months in reverse 0 points  Repeat phrase 0 points  Total Score 0    Immunization History  Administered Date(s) Administered  . Fluad Quad(high Dose 65+) 05/06/2019  . Influenza Split 04/13/2011, 04/09/2012  . Influenza Whole 04/12/2010  . Influenza, High Dose  Seasonal PF 04/26/2017, 04/30/2018  . Influenza,inj,Quad PF,6+ Mos 04/10/2013, 04/16/2014, 04/22/2015, 04/25/2016  . Pneumococcal Conjugate-13 04/30/2014  . Pneumococcal Polysaccharide-23 03/08/2009  . Td 03/08/2009  . Zoster 04/09/2012    Qualifies for Shingles Vaccine? Yes  Screening Tests Health Maintenance  Topic Date Due  . COVID-19 Vaccine (1) Never done  . TETANUS/TDAP  05/05/2020 (Originally 03/09/2019)  . INFLUENZA VACCINE  02/21/2020  . COLONOSCOPY  06/27/2020  . PNA vac Low Risk Adult  Completed   Cancer Screenings: Lung: Low Dose CT Chest recommended if Age 40-80 years, 30 pack-year currently smoking OR have quit w/in 15years. Patient does not qualify. Colorectal: due 2021      Plan:    Reviewed health maintenance screenings with patient today and relevant education, vaccines, and/or referrals were provided.    Continue doing brain stimulating activities (puzzles, reading, adult coloring books, staying active) to keep memory sharp.    Continue to eat heart healthy diet (full of fruits, vegetables, whole grains, lean protein, water--limit salt, fat, and sugar intake) and increase physical activity as tolerated.   I have personally reviewed and noted the following in the patient's chart:   . Medical and social history . Use of alcohol, tobacco or illicit drugs  . Current medications and supplements . Functional ability and status . Nutritional status . Physical activity . Advanced directives . List of other physicians . Hospitalizations, surgeries, and ER visits in previous 12 months . Vitals . Screenings to include cognitive, depression, and falls . Referrals and appointments  In addition, I have reviewed and discussed with patient certain preventive protocols, quality metrics, and best practice recommendations. A written personalized care plan for preventive services as well as general preventive health recommendations were provided to patient.  Sheral Flow, LPN  [id number]  Nurse Health Advisor   Nurse note: Vitamin B-12 injection to left deltoid.  Patient tolerated well. No s/s of infection or adverse reaction.  Patient ambulatory out with no difficulty.  Medical screening examination/treatment/procedure(s) were performed by non-physician practitioner and as supervising physician I was immediately available for consultation/collaboration. I agree with above. [name], MD

## 2019-11-16 ENCOUNTER — Ambulatory Visit: Payer: Medicare Other

## 2020-02-01 ENCOUNTER — Other Ambulatory Visit: Payer: Self-pay | Admitting: Internal Medicine

## 2020-04-28 DIAGNOSIS — R490 Dysphonia: Secondary | ICD-10-CM | POA: Insufficient documentation

## 2020-05-06 ENCOUNTER — Ambulatory Visit (INDEPENDENT_AMBULATORY_CARE_PROVIDER_SITE_OTHER): Payer: Medicare Other | Admitting: Internal Medicine

## 2020-05-06 ENCOUNTER — Encounter: Payer: Self-pay | Admitting: Internal Medicine

## 2020-05-06 ENCOUNTER — Other Ambulatory Visit: Payer: Self-pay

## 2020-05-06 VITALS — BP 122/64 | HR 77 | Temp 98.5°F | Ht 65.0 in | Wt 161.0 lb

## 2020-05-06 DIAGNOSIS — E785 Hyperlipidemia, unspecified: Secondary | ICD-10-CM | POA: Diagnosis not present

## 2020-05-06 DIAGNOSIS — Z0001 Encounter for general adult medical examination with abnormal findings: Secondary | ICD-10-CM | POA: Diagnosis not present

## 2020-05-06 DIAGNOSIS — R739 Hyperglycemia, unspecified: Secondary | ICD-10-CM | POA: Diagnosis not present

## 2020-05-06 DIAGNOSIS — Z23 Encounter for immunization: Secondary | ICD-10-CM

## 2020-05-06 DIAGNOSIS — E538 Deficiency of other specified B group vitamins: Secondary | ICD-10-CM | POA: Diagnosis not present

## 2020-05-06 DIAGNOSIS — Z1159 Encounter for screening for other viral diseases: Secondary | ICD-10-CM | POA: Diagnosis not present

## 2020-05-06 DIAGNOSIS — E559 Vitamin D deficiency, unspecified: Secondary | ICD-10-CM | POA: Diagnosis not present

## 2020-05-06 DIAGNOSIS — N3281 Overactive bladder: Secondary | ICD-10-CM

## 2020-05-06 DIAGNOSIS — R350 Frequency of micturition: Secondary | ICD-10-CM

## 2020-05-06 DIAGNOSIS — Z0000 Encounter for general adult medical examination without abnormal findings: Secondary | ICD-10-CM

## 2020-05-06 DIAGNOSIS — N401 Enlarged prostate with lower urinary tract symptoms: Secondary | ICD-10-CM | POA: Diagnosis not present

## 2020-05-06 LAB — CBC WITH DIFFERENTIAL/PLATELET
Basophils Absolute: 0.2 10*3/uL — ABNORMAL HIGH (ref 0.0–0.1)
Basophils Relative: 2.3 % (ref 0.0–3.0)
Eosinophils Absolute: 0.4 10*3/uL (ref 0.0–0.7)
Eosinophils Relative: 4.8 % (ref 0.0–5.0)
HCT: 43.4 % (ref 39.0–52.0)
Hemoglobin: 14.7 g/dL (ref 13.0–17.0)
Lymphocytes Relative: 19.2 % (ref 12.0–46.0)
Lymphs Abs: 1.5 10*3/uL (ref 0.7–4.0)
MCHC: 33.9 g/dL (ref 30.0–36.0)
MCV: 94.9 fl (ref 78.0–100.0)
Monocytes Absolute: 0.6 10*3/uL (ref 0.1–1.0)
Monocytes Relative: 8 % (ref 3.0–12.0)
Neutro Abs: 5.2 10*3/uL (ref 1.4–7.7)
Neutrophils Relative %: 65.7 % (ref 43.0–77.0)
Platelets: 223 10*3/uL (ref 150.0–400.0)
RBC: 4.57 Mil/uL (ref 4.22–5.81)
RDW: 12.2 % (ref 11.5–15.5)
WBC: 7.9 10*3/uL (ref 4.0–10.5)

## 2020-05-06 LAB — BASIC METABOLIC PANEL
BUN: 24 mg/dL — ABNORMAL HIGH (ref 6–23)
CO2: 27 mEq/L (ref 19–32)
Calcium: 9.3 mg/dL (ref 8.4–10.5)
Chloride: 101 mEq/L (ref 96–112)
Creatinine, Ser: 1.05 mg/dL (ref 0.40–1.50)
GFR: 67.54 mL/min (ref >60.00)
Glucose, Bld: 95 mg/dL (ref 70–99)
Potassium: 4.3 mEq/L (ref 3.5–5.1)
Sodium: 136 mEq/L (ref 135–145)

## 2020-05-06 LAB — HEPATIC FUNCTION PANEL
ALT: 29 U/L (ref 0–53)
AST: 27 U/L (ref 0–37)
Albumin: 4.5 g/dL (ref 3.5–5.2)
Alkaline Phosphatase: 44 U/L (ref 39–117)
Bilirubin, Direct: 0.1 mg/dL (ref 0.0–0.3)
Total Bilirubin: 0.8 mg/dL (ref 0.2–1.2)
Total Protein: 7.1 g/dL (ref 6.0–8.3)

## 2020-05-06 LAB — URINALYSIS, ROUTINE W REFLEX MICROSCOPIC
Bilirubin Urine: NEGATIVE
Hgb urine dipstick: NEGATIVE
Ketones, ur: NEGATIVE
Leukocytes,Ua: NEGATIVE
Nitrite: NEGATIVE
RBC / HPF: NONE SEEN (ref 0–?)
Specific Gravity, Urine: 1.025 (ref 1.000–1.030)
Total Protein, Urine: NEGATIVE
Urine Glucose: NEGATIVE
Urobilinogen, UA: 0.2 (ref 0.0–1.0)
pH: 6 (ref 5.0–8.0)

## 2020-05-06 LAB — LIPID PANEL
Cholesterol: 121 mg/dL (ref 0–200)
HDL: 37.2 mg/dL — ABNORMAL LOW (ref >39.00)
LDL Cholesterol: 50 mg/dL (ref 0–99)
NonHDL: 83.9
Total CHOL/HDL Ratio: 3
Triglycerides: 172 mg/dL — ABNORMAL HIGH (ref 0.0–149.0)
VLDL: 34.4 mg/dL (ref 0.0–40.0)

## 2020-05-06 LAB — TSH: TSH: 1.25 u[IU]/mL (ref 0.35–4.50)

## 2020-05-06 LAB — VITAMIN D 25 HYDROXY (VIT D DEFICIENCY, FRACTURES): VITD: 23.11 ng/mL — ABNORMAL LOW (ref 30.00–100.00)

## 2020-05-06 LAB — PSA: PSA: 1.55 ng/mL (ref 0.10–4.00)

## 2020-05-06 LAB — HEMOGLOBIN A1C: Hgb A1c MFr Bld: 6.4 % (ref 4.6–6.5)

## 2020-05-06 LAB — VITAMIN B12: Vitamin B-12: >1526 pg/mL — ABNORMAL HIGH (ref 211–911)

## 2020-05-06 NOTE — Patient Instructions (Addendum)
You had the flu shot today, and the Tdap tetanus shot today  Please take all new medication as prescribed - the vesicare 5 mg per day for bladder  Please continue all other medications as before, and refills have been done if requested, including the flomax  Please have the pharmacy call with any other refills you may need.  Please continue your efforts at being more active, low cholesterol diet, and weight control.  You are otherwise up to date with prevention measures today.  Please keep your appointments with your specialists as you may have planned  Please go to the LAB at the blood drawing area for the tests to be done  You will be contacted by phone if any changes need to be made immediately.  Otherwise, you will receive a letter about your results with an explanation, but please check with MyChart first.  Please remember to sign up for MyChart if you have not done so, as this will be important to you in the future with finding out test results, communicating by private email, and scheduling acute appointments online when needed.  Please make an Appointment to return for your 1 year visit, or sooner if needed

## 2020-05-06 NOTE — Progress Notes (Signed)
Subjective:    Patient ID: Brian Branch, male    DOB: [birth year], 79 y.o.   MRN: [id number]  HPI  Here for wellness and f/u;  Overall doing ok;  Pt denies Chest pain, worsening SOB, DOE, wheezing, orthopnea, PND, worsening LE edema, palpitations, dizziness or syncope.  Pt denies neurological change such as new headache, facial or extremity weakness.  Pt denies polydipsia, polyuria, or low sugar symptoms. Pt states overall good compliance with treatment and medications, good tolerability, and has been trying to follow appropriate diet.  Pt denies worsening depressive symptoms, suicidal ideation or panic. No fever, night sweats, wt loss, loss of appetite, or other constitutional symptoms.  Pt states good ability with ADL's, has low fall risk, home safety reviewed and adequate, no other significant changes in hearing or vision, and only occasionally active with exercise. Wt Readings from Last 3 Encounters:  05/06/20 161 lb (73 kg)  11/11/19 164 lb 6.4 oz (74.6 kg)  05/06/19 166 lb (75.3 kg)  Recently saw Dr Redmond Baseman for hoarseness and voice change, had the nasopharyngoscopy, rx PPI but now worse, so plan is for near vocal cord surgury.  Also has ongoing urinary frequency and multiple near accidents in the last few months. flomax helps but not for this, asks for increased flomax as well.  Tolerating b12 and vit d supplements Past Medical History:  Diagnosis Date  . ALLERGIC RHINITIS 02/15/2007  . Allergy   . BENIGN PROSTATIC HYPERTROPHY 02/15/2007  . CAD (coronary artery disease)   . COLONIC POLYPS, HX OF 08/29/2007  . CORONARY ARTERY DISEASE 02/15/2007  . 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 DEPRESSION 08/29/2007   pt denies having depression  . ED (erectile dysfunction)   . FATIGUE 08/29/2007  . GERD (gastroesophageal reflux disease)   . Heart attack ([place])    [place]  . HYPERLIPIDEMIA 02/15/2007  . INSOMNIA-SLEEP DISORDER-UNSPEC 03/09/2010  . KNEE PAIN, LEFT 08/29/2007  .  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 SHINGLES, 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  . TOBACCO ABUSE 03/09/2010  . Unspecified eustachian tube disorder 03/09/2010   Past Surgical History:  Procedure Laterality Date  . APPENDECTOMY  1951  . COLONOSCOPY    . CORONARY ARTERY BYPASS GRAFT  1991  . POLYPECTOMY    . TONSILLECTOMY AND ADENOIDECTOMY      reports that he quit smoking about 11 years ago. He has never used smokeless tobacco. He reports current alcohol use of about 2.0 standard drinks of alcohol per week. He reports that he does not use drugs. family history includes Arthritis in his mother; Heart disease in his father. Allergies  Allergen Reactions  . Levitra [Vardenafil Hydrochloride]     headache  . Lincomycin Hives  . Penicillins     REACTION: Hives  . Tadalafil     REACTION: headache  . Triazolam     REACTION: hallucinations  . Vardenafil Nausea And Vomiting    headache   Current Outpatient Medications on File Prior to Visit  Medication Sig Dispense Refill  . aspirin (ASPIRIN LOW DOSE) 81 MG EC tablet TAKE 1 TABLET DAILY (SWALLOW WHOLE) 90 tablet 3  . [place]wellia-Glucosamine-Vit D (HM GLUCOSAMINE & VITAMIN D3) TABS Take 1 tablet by mouth 2 (two) times daily.    . chlorhexidine (PERIDEX) 0.12 % solution 15 mLs 2 (two) times daily.    . fluticasone (FLONASE) 50 MCG/ACT nasal spray Place 2 sprays into both nostrils daily. 48 g 3  . ibuprofen (ADVIL) 600 MG  tablet Take 600 mg by mouth.     No current facility-administered medications on file prior to visit.   Review of Systems All otherwise neg per pt    Objective:   Physical Exam BP 122/64 (BP Location: Left Arm, Patient Position: Sitting, Cuff Size: Large)   Pulse 77   Temp 98.5 F (36.9 C) (Oral)   Ht 5\' 5"  (1.651 m)   Wt 161 lb (73 kg)   SpO2 97%   BMI 26.79 kg/m  VS noted,  Constitutional: Pt appears in NAD HENT: Head: NCAT.  Right Ear: External ear normal.  Left Ear: External ear normal.  Eyes: . Pupils are equal,  round, and reactive to light. Conjunctivae and EOM are normal Nose: without d/c or deformity Neck: Neck supple. Gross normal ROM Cardiovascular: Normal rate and regular rhythm.   Pulmonary/Chest: Effort normal and breath sounds without rales or wheezing.  Abd:  Soft, NT, ND, + BS, no organomegaly Neurological: Pt is alert. At baseline orientation, motor grossly intact Skin: Skin is warm. No rashes, other new lesions, no LE edema Psychiatric: Pt behavior is normal without agitation  All otherwise neg per pt Lab Results  Component Value Date   WBC 7.9 05/06/2020   HGB 14.7 05/06/2020   HCT 43.4 05/06/2020   PLT 223.0 05/06/2020   GLUCOSE 95 05/06/2020   CHOL 121 05/06/2020   TRIG 172.0 (H) 05/06/2020   HDL 37.20 (L) 05/06/2020   LDLDIRECT 72.0 04/30/2018   LDLCALC 50 05/06/2020   ALT 29 05/06/2020   AST 27 05/06/2020   NA 136 05/06/2020   K 4.3 05/06/2020   CL 101 05/06/2020   CREATININE 1.05 05/06/2020   BUN 24 (H) 05/06/2020   CO2 27 05/06/2020   TSH 1.25 05/06/2020   PSA 1.55 05/06/2020   HGBA1C 6.4 05/06/2020      Assessment & Plan:

## 2020-05-07 ENCOUNTER — Other Ambulatory Visit: Payer: Self-pay | Admitting: Internal Medicine

## 2020-05-07 ENCOUNTER — Encounter: Payer: Self-pay | Admitting: Internal Medicine

## 2020-05-07 MED ORDER — OMEPRAZOLE 40 MG PO CPDR
40.0000 mg | DELAYED_RELEASE_CAPSULE | Freq: Two times a day (BID) | ORAL | 3 refills | Status: DC
Start: 2020-05-07 — End: 2021-05-09

## 2020-05-07 MED ORDER — VITAMIN D (ERGOCALCIFEROL) 1.25 MG (50000 UNIT) PO CAPS: 50000.0000 [IU] | ORAL_CAPSULE | ORAL | 0 refills | Status: DC

## 2020-05-07 MED ORDER — ROSUVASTATIN CALCIUM 20 MG PO TABS
20.0000 mg | ORAL_TABLET | Freq: Every day | ORAL | 3 refills | Status: DC
Start: 2020-05-07 — End: 2021-05-09

## 2020-05-07 MED ORDER — TAMSULOSIN HCL 0.4 MG PO CAPS: 0.4000 mg | ORAL_CAPSULE | Freq: Every day | ORAL | 3 refills | Status: DC

## 2020-05-08 ENCOUNTER — Encounter: Payer: Self-pay | Admitting: Internal Medicine

## 2020-05-08 NOTE — Assessment & Plan Note (Signed)
stable overall by history and exam, recent data reviewed with pt, and pt to continue medical treatment as before,  to f/u any worsening symptoms or concerns  

## 2020-05-08 NOTE — Assessment & Plan Note (Signed)
Ok for [employer] .4 bid

## 2020-05-08 NOTE — Assessment & Plan Note (Signed)

## 2020-05-08 NOTE — Assessment & Plan Note (Signed)
Cont oral replacement 

## 2020-05-08 NOTE — Assessment & Plan Note (Addendum)
Ok for vesicare 5 qd  I spent 41 minutes in addition to time for CPX wellness examination in preparing to see the patient by review of recent labs, imaging and procedures, obtaining and reviewing separately obtained history, communicating with the patient and family or caregiver, ordering medications, tests or procedures, and documenting clinical information in the EHR including the differential Dx, treatment, and any further evaluation and other management of oab, bph, hyperglycemia, hld, vit d and b12 def,

## 2020-05-09 LAB — HEPATITIS C ANTIBODY
Hepatitis C Ab: NONREACTIVE
SIGNAL TO CUT-OFF: 0.15 (ref <1.00)

## 2020-05-10 ENCOUNTER — Other Ambulatory Visit: Payer: Self-pay | Admitting: Otolaryngology

## 2020-05-10 MED ORDER — SOLIFENACIN SUCCINATE 5 MG PO TABS: 5.0000 mg | ORAL_TABLET | Freq: Every day | ORAL | 3 refills | Status: DC

## 2020-05-10 MED ORDER — ASPIRIN 81 MG PO TBEC
DELAYED_RELEASE_TABLET | ORAL | 3 refills | Status: DC
Start: 2020-05-10 — End: 2021-05-09

## 2020-05-10 MED ORDER — FLUTICASONE PROPIONATE 50 MCG/ACT NA SUSP
2.0000 | Freq: Every day | NASAL | 3 refills | Status: DC
Start: 2020-05-10 — End: 2021-05-09

## 2020-05-10 NOTE — Addendum Note (Signed)
Addended by: Biagio Borg on: 05/10/2020 01:13 PM   Modules accepted: Orders

## 2020-05-24 ENCOUNTER — Encounter: Payer: Self-pay | Admitting: Internal Medicine

## 2020-05-25 NOTE — Progress Notes (Signed)
[id number] [place] [address], [address] AT [employer] OF [address] [place] [address] Phone: [phone] Fax: [phone]  [facility] [place], [place] [address] [place] [address] Phone: [phone] Fax: [phone]      Your procedure is scheduled on Wednesday June 01, 2020.  Report to [facility] Main Entrance "A" at 08:30 A.M., and check in at the Admitting office.  Call this number if you have problems or questions the morning of surgery:  [phone]    Remember:  Do not eat or drink after midnight the night before your surgery    Take these medicines the morning of surgery with A SIP OF WATER: Diphenhydramine (Benadryl) Fluticasone (Flonase) nasal spray Omeprazole (Prilosec) Solifenacin (Vesicare) Tamsulosin (Flomax)   If needed you may take the following medications: Acetaminophen (Tylenol)   As of today, STOP taking any Aspirin-containing products, Aleve, Naproxen, Ibuprofen, Motrin, Advil, Goody's, BC's, all herbal medications, fish oil, and all vitamins.  Follow your surgeon's instructions on when to stop Aspirin.  If no instructions were given by your surgeon then you will need to call the office to get those instructions.                       Do not wear jewelry            Do not wear lotions, powders, colognes, or deodorant.            Do not shave 48 hours prior to surgery.  Men may shave face and neck.            Do not bring valuables to the hospital.            [facility] is not responsible for any belongings or valuables.  Do NOT Smoke (Tobacco/Vaping) or drink Alcohol 24 hours prior to your procedure  If you use a CPAP at night, you may bring all equipment for your overnight stay.   Contacts, glasses, dentures or bridgework may not be worn into surgery.      For patients admitted to the hospital, discharge time will be determined by  your treatment team.   Patients discharged the day of surgery will not be allowed to drive home, and someone needs to stay with them for 24 hours.    Special instructions:   [facility]- Preparing For Surgery  Before surgery, you can play an important role. Because skin is not sterile, your skin needs to be as free of germs as possible. You can reduce the number of germs on your skin by washing with CHG (chlorahexidine gluconate) Soap before surgery.  CHG is an antiseptic cleaner which kills germs and bonds with the skin to continue killing germs even after washing.    Oral Hygiene is also important to reduce your risk of infection.  Remember - BRUSH YOUR TEETH THE MORNING OF SURGERY WITH YOUR REGULAR TOOTHPASTE  Please do not use if you have an allergy to CHG or antibacterial soaps. If your skin becomes reddened/irritated stop using the CHG.  Do not shave (including legs and underarms) for at least 48 hours prior to first CHG shower. It is OK to shave your face.  Please follow these instructions carefully.   1. Shower the NIGHT BEFORE SURGERY and the MORNING OF SURGERY with CHG Soap.   2. If you chose to wash  your hair, wash your hair first as usual with your normal shampoo.  3. After you shampoo, rinse your hair and body thoroughly to remove the shampoo.  4. Use CHG as you would any other liquid soap. You can apply CHG directly to the skin and wash gently with a scrungie or a clean washcloth.   5. Apply the CHG Soap to your body ONLY FROM THE NECK DOWN.  Do not use on open wounds or open sores. Avoid contact with your eyes, ears, mouth and genitals (private parts). Wash Face and genitals (private parts)  with your normal soap.   6. Wash thoroughly, paying special attention to the area where your surgery will be performed.  7. Thoroughly rinse your body with warm water from the neck down.  8. DO NOT shower/wash with your normal soap after using and rinsing off the CHG Soap.  9. Pat  yourself dry with a CLEAN TOWEL.  10. Wear CLEAN PAJAMAS to bed the night before surgery  11. Place CLEAN SHEETS on your bed the night of your first shower and DO NOT SLEEP WITH PETS.   Day of Surgery: Shower with CHG soap as directed Wear Clean/Comfortable clothing the morning of surgery Do not apply any deodorants/lotions.   Remember to brush your teeth WITH YOUR REGULAR TOOTHPASTE.   Please read over the following fact sheets that you were given.

## 2020-05-26 ENCOUNTER — Other Ambulatory Visit: Payer: Self-pay

## 2020-05-26 ENCOUNTER — Encounter (HOSPITAL_COMMUNITY)
Admission: RE | Admit: 2020-05-26 | Discharge: 2020-05-26 | Disposition: A | Discharge status: Home / Self Care | Payer: Medicare Other | Source: Ambulatory Visit | Attending: Otolaryngology | Admitting: Otolaryngology

## 2020-05-26 ENCOUNTER — Encounter: Payer: Self-pay | Admitting: Internal Medicine

## 2020-05-26 ENCOUNTER — Encounter (HOSPITAL_COMMUNITY): Payer: Self-pay

## 2020-05-26 DIAGNOSIS — Z01818 Encounter for other preprocedural examination: Secondary | ICD-10-CM | POA: Insufficient documentation

## 2020-05-26 DIAGNOSIS — I252 Old myocardial infarction: Secondary | ICD-10-CM | POA: Diagnosis not present

## 2020-05-26 LAB — BASIC METABOLIC PANEL
Anion gap: 10 (ref 5–15)
BUN: 18 mg/dL (ref 8–23)
CO2: 25 mmol/L (ref 22–32)
Calcium: 9.3 mg/dL (ref 8.9–10.3)
Chloride: 101 mmol/L (ref 98–111)
Creatinine, Ser: 1.12 mg/dL (ref 0.61–1.24)
GFR, Estimated: >60 mL/min (ref >60)
Glucose, Bld: 122 mg/dL — ABNORMAL HIGH (ref 70–99)
Potassium: 3.9 mmol/L (ref 3.5–5.1)
Sodium: 136 mmol/L (ref 135–145)

## 2020-05-26 LAB — CBC
HCT: 46.1 % (ref 39.0–52.0)
Hemoglobin: 15.1 g/dL (ref 13.0–17.0)
MCH: 32 pg (ref 26.0–34.0)
MCHC: 32.8 g/dL (ref 30.0–36.0)
MCV: 97.7 fL (ref 80.0–100.0)
Platelets: 233 10*3/uL (ref 150–400)
RBC: 4.72 MIL/uL (ref 4.22–5.81)
RDW: 12 % (ref 11.5–15.5)
WBC: 7.9 10*3/uL (ref 4.0–10.5)
nRBC: 0 % (ref 0.0–0.2)

## 2020-05-26 NOTE — Progress Notes (Signed)
PCP: Dr. Jenny Reichmann Cardiologist: None recently-- Had MI in 1991 in [place].  Had cardiac cath 1991, Triple bypass.  Followed up with cardiology for 5 years after MI, then stopped.  Has lived in [place] for 19 years and has never seen cardiology in [place].  EKG: Today CXR: n/a ECHO: 04/2018 Stress Test: Cardiac Cath: 1991--release of information obtained, will attempt to fax previous cardiologist in [place] for cardiac records, Dr. Kavin Leech. Bypass surgery performed at [facility]   Patient denies shortness of breath, fever, cough, and chest pain at PAT appointment.  Patient verbalized understanding of instructions provided today at the PAT appointment.  Patient asked to review instructions at home and day of surgery.   Chart forwarded to anesthesia for review

## 2020-05-28 ENCOUNTER — Other Ambulatory Visit (HOSPITAL_COMMUNITY)
Admission: RE | Admit: 2020-05-28 | Discharge: 2020-05-28 | Disposition: A | Discharge status: Home / Self Care | Payer: Medicare Other | Source: Ambulatory Visit | Attending: Otolaryngology | Admitting: Otolaryngology

## 2020-05-28 DIAGNOSIS — Z20822 Contact with and (suspected) exposure to covid-19: Secondary | ICD-10-CM | POA: Diagnosis not present

## 2020-05-28 DIAGNOSIS — Z01812 Encounter for preprocedural laboratory examination: Secondary | ICD-10-CM | POA: Insufficient documentation

## 2020-05-28 LAB — SARS CORONAVIRUS 2 (TAT 6-24 HRS): SARS Coronavirus 2: NEGATIVE

## 2020-05-31 NOTE — Progress Notes (Addendum)
Anesthesia Chart Review:  Patient reports remote history of MI and cardiac bypass in 1991 done in [place].  He did follow with cardiology for a number of years after this, but since moving to [facility] his chronic medical conditions have been followed by his PCP Dr. [name].  Echo 2019 showed EF 55 to 60%, hypokinesis of the basal mid inferolateral and inferior myocardium; consistent with infarction in the distribution of the right coronary or left circumflex coronary artery, grade 1 diastolic function, no significant valvular abnormalities.  He was last seen by Dr. Jenny Reichmann on 05/08/2020, and per notes he was doing well.  Denies any cardiovascular complaints.  Per patient message encounter 05/26/2020, Dr. Jenny Reichmann cleared patient to hold aspirin.  Per note, "okay to hold the aspirin for 5 days prior to the appointment avoid any bleeding more than medically necessary, then okay to start the next day after the procedure."  Preop labs reviewed, unremarkable.  EKG 05/26/2020: Normal sinus rhythm.  Rate 74. Nonspecific T wave abnormality  CHEST - 2 VIEW 10/18/2017: COMPARISON:  11/18/2015 and prior chest radiographs  FINDINGS: Cardiomegaly and CABG changes again noted.  There is no evidence of focal airspace disease, pulmonary edema, suspicious pulmonary nodule/mass, pleural effusion, or pneumothorax.  No acute bony abnormalities are identified.  IMPRESSION: Cardiomegaly without evidence of acute cardiopulmonary disease.  TTE 05/01/2018: - Left ventricle: The cavity size was normal. Wall thickness was  normal. Systolic function was normal. The estimated ejection  fraction was in the range of 55% to 60%. Hypokinesis of the  basal-midinferolateral and inferior myocardium; consistent with  infarction in the distribution of the right coronary or left  circumflex coronary artery. Doppler parameters are consistent  with abnormal left ventricular relaxation (grade 1 diastolic   dysfunction).  - Aortic valve: There was trivial regurgitation.  - Mitral valve: There was mild regurgitation. Valve area by  pressure half-time: 2.06 cm^2.    Wynonia Musty [facility] Short Stay Center/Anesthesiology Phone [phone] 05/31/2020 4:32 PM

## 2020-05-31 NOTE — Anesthesia Preprocedure Evaluation (Addendum)
Anesthesia Evaluation  Patient identified by MRN, date of birth, ID band Patient awake  General Assessment Comment:Dysphonia  Reviewed: Allergy & Precautions, NPO status , Patient's Chart, lab work & pertinent test results  History of Anesthesia Complications Negative for: history of anesthetic complications  Airway Mallampati: II  TM Distance: <3 FB Neck ROM: Full    Dental  (+) Chipped, Missing   Pulmonary former smoker,    Pulmonary exam normal breath sounds clear to auscultation       Cardiovascular + CAD, + Past MI and + CABG  Normal cardiovascular exam Rhythm:Regular Rate:Normal     Neuro/Psych PSYCHIATRIC DISORDERS Depression negative neurological ROS     GI/Hepatic Neg liver ROS, GERD  Medicated,  Endo/Other  negative endocrine ROS  Renal/GU negative Renal ROS     Musculoskeletal  (+) Arthritis ,   Abdominal   Peds  Hematology negative hematology ROS (+)   Anesthesia Other Findings Day of surgery medications reviewed with the patient.  Reproductive/Obstetrics                           Anesthesia Physical Anesthesia Plan  ASA: III  Anesthesia Plan: General   Post-op Pain Management:    Induction: Intravenous  PONV Risk Score and Plan: 2 and Dexamethasone, Ondansetron and Propofol infusion  Airway Management Planned: Mask and Natural Airway  Additional Equipment:   Intra-op Plan:   Post-operative Plan:   Informed Consent: I have reviewed the patients History and Physical, chart, labs and discussed the procedure including the risks, benefits and alternatives for the proposed anesthesia with the patient or authorized representative who has indicated his/her understanding and acceptance.       Plan Discussed with: CRNA  Anesthesia Plan Comments: (JET Ventilation  PAT note by Karoline Caldwell, PA-C: Patient reports remote history of MI and cardiac bypass in 1991 done in  [place].  He did follow with cardiology for a number of years after this, but since moving to [facility] his chronic medical conditions have been followed by his PCP Dr. [name].  Echo 2019 showed EF 55 to 60%, hypokinesis of the basal mid inferolateral and inferior myocardium; consistent with infarction in the distribution of the right coronary or left circumflex coronary artery, grade 1 diastolic function, no significant valvular abnormalities.  He was last seen by Dr. Jenny Reichmann on 05/08/2020, and per notes he was doing well.  Denies any cardiovascular complaints.  Per patient message encounter 05/26/2020, Dr. Jenny Reichmann cleared patient to hold aspirin.  Per note, "okay to hold the aspirin for 5 days prior to the appointment avoid any bleeding more than medically necessary, then okay to start the next day after the procedure."  Preop labs reviewed, unremarkable.  EKG 05/26/2020: Normal sinus rhythm.  Rate 74. Nonspecific T wave abnormality  CHEST - 2 VIEW 10/18/2017: COMPARISON:  11/18/2015 and prior chest radiographs  FINDINGS: Cardiomegaly and CABG changes again noted.  There is no evidence of focal airspace disease, pulmonary edema, suspicious pulmonary nodule/mass, pleural effusion, or pneumothorax.  No acute bony abnormalities are identified.  IMPRESSION: Cardiomegaly without evidence of acute cardiopulmonary disease.  TTE 05/01/2018: - Left ventricle: The cavity size was normal. Wall thickness was  normal. Systolic function was normal. The estimated ejection  fraction was in the range of 55% to 60%. Hypokinesis of the  basal-midinferolateral and inferior myocardium; consistent with  infarction in the distribution of the right coronary or left  circumflex coronary artery. Doppler  parameters are consistent  with abnormal left ventricular relaxation (grade 1 diastolic  dysfunction).  - Aortic valve: There was trivial regurgitation.  - Mitral valve: There was mild  regurgitation. Valve area by  pressure half-time: 2.06 cm^2.   )     Anesthesia Quick Evaluation

## 2020-06-01 ENCOUNTER — Ambulatory Visit (HOSPITAL_COMMUNITY): Payer: Medicare Other | Admitting: Physician Assistant

## 2020-06-01 ENCOUNTER — Encounter (HOSPITAL_COMMUNITY): Payer: Self-pay | Admitting: Otolaryngology

## 2020-06-01 ENCOUNTER — Ambulatory Visit (HOSPITAL_COMMUNITY)
Admission: RE | Admit: 2020-06-01 | Discharge: 2020-06-01 | Disposition: A | Discharge status: Home / Self Care | Payer: Medicare Other | Attending: Otolaryngology | Admitting: Otolaryngology

## 2020-06-01 ENCOUNTER — Ambulatory Visit (HOSPITAL_COMMUNITY): Payer: Medicare Other | Admitting: Certified Registered Nurse Anesthetist

## 2020-06-01 ENCOUNTER — Encounter (HOSPITAL_COMMUNITY)
Admission: RE | Disposition: A | Discharge status: Home / Self Care | Payer: Self-pay | Source: Home / Self Care | Attending: Otolaryngology

## 2020-06-01 ENCOUNTER — Other Ambulatory Visit: Payer: Self-pay

## 2020-06-01 DIAGNOSIS — J383 Other diseases of vocal cords: Secondary | ICD-10-CM | POA: Diagnosis not present

## 2020-06-01 DIAGNOSIS — R490 Dysphonia: Secondary | ICD-10-CM | POA: Diagnosis present

## 2020-06-01 DIAGNOSIS — Z7982 Long term (current) use of aspirin: Secondary | ICD-10-CM | POA: Insufficient documentation

## 2020-06-01 DIAGNOSIS — Z880 Allergy status to penicillin: Secondary | ICD-10-CM | POA: Insufficient documentation

## 2020-06-01 DIAGNOSIS — Z87891 Personal history of nicotine dependence: Secondary | ICD-10-CM | POA: Insufficient documentation

## 2020-06-01 DIAGNOSIS — Z888 Allergy status to other drugs, medicaments and biological substances status: Secondary | ICD-10-CM | POA: Insufficient documentation

## 2020-06-01 DIAGNOSIS — Z79899 Other long term (current) drug therapy: Secondary | ICD-10-CM | POA: Insufficient documentation

## 2020-06-01 HISTORY — PX: MICROLARYNGOSCOPY: SHX5208

## 2020-06-01 SURGERY — MICROLARYNGOSCOPY
Anesthesia: General | Site: Mouth

## 2020-06-01 MED ORDER — CHLORHEXIDINE GLUCONATE 0.12 % MT SOLN
OROMUCOSAL | Status: AC
  Administered 2020-06-01: 15 mL via OROMUCOSAL
  Filled 2020-06-01: qty 15

## 2020-06-01 MED ORDER — PROPOFOL 10 MG/ML IV BOLUS
INTRAVENOUS | Status: AC
  Filled 2020-06-01: qty 20

## 2020-06-01 MED ORDER — ESMOLOL HCL 100 MG/10ML IV SOLN
INTRAVENOUS | Status: DC | PRN
  Administered 2020-06-01: 20 mg via INTRAVENOUS

## 2020-06-01 MED ORDER — LIDOCAINE HCL (CARDIAC) PF 100 MG/5ML IV SOSY
PREFILLED_SYRINGE | INTRAVENOUS | Status: DC | PRN
  Administered 2020-06-01: 40 mg via INTRAVENOUS

## 2020-06-01 MED ORDER — EPINEPHRINE PF 1 MG/ML IJ SOLN
INTRAMUSCULAR | Status: DC | PRN
  Administered 2020-06-01: 1 mg

## 2020-06-01 MED ORDER — PROPOFOL 500 MG/50ML IV EMUL
INTRAVENOUS | Status: DC | PRN
  Administered 2020-06-01: 100 ug/kg/min via INTRAVENOUS

## 2020-06-01 MED ORDER — FENTANYL CITRATE (PF) 100 MCG/2ML IJ SOLN: 25.0000 ug | INTRAMUSCULAR | Status: DC | PRN

## 2020-06-01 MED ORDER — CHLORHEXIDINE GLUCONATE 0.12 % MT SOLN: 15.0000 mL | Freq: Once | OROMUCOSAL | Status: AC

## 2020-06-01 MED ORDER — ORAL CARE MOUTH RINSE: 15.0000 mL | Freq: Once | OROMUCOSAL | Status: AC

## 2020-06-01 MED ORDER — SUCCINYLCHOLINE CHLORIDE 20 MG/ML IJ SOLN
INTRAMUSCULAR | Status: DC | PRN
  Administered 2020-06-01: 160 mg via INTRAVENOUS

## 2020-06-01 MED ORDER — 0.9 % SODIUM CHLORIDE (POUR BTL) OPTIME
TOPICAL | Status: DC | PRN
  Administered 2020-06-01: 1000 mL

## 2020-06-01 MED ORDER — EPINEPHRINE HCL (NASAL) 0.1 % NA SOLN
NASAL | Status: AC
  Filled 2020-06-01: qty 30

## 2020-06-01 MED ORDER — ESMOLOL HCL 100 MG/10ML IV SOLN
INTRAVENOUS | Status: AC
  Filled 2020-06-01: qty 10

## 2020-06-01 MED ORDER — ONDANSETRON HCL 4 MG/2ML IJ SOLN: 4.0000 mg | Freq: Once | INTRAMUSCULAR | Status: DC | PRN

## 2020-06-01 MED ORDER — LIDOCAINE 2% (20 MG/ML) 5 ML SYRINGE
INTRAMUSCULAR | Status: AC
  Filled 2020-06-01: qty 5

## 2020-06-01 MED ORDER — DEXAMETHASONE SODIUM PHOSPHATE 10 MG/ML IJ SOLN
INTRAMUSCULAR | Status: DC | PRN
  Administered 2020-06-01: 10 mg via INTRAVENOUS

## 2020-06-01 MED ORDER — ONDANSETRON HCL 4 MG/2ML IJ SOLN
INTRAMUSCULAR | Status: AC
  Filled 2020-06-01: qty 2

## 2020-06-01 MED ORDER — ACETAMINOPHEN 500 MG PO TABS: 1000.0000 mg | ORAL_TABLET | Freq: Once | ORAL | Status: AC

## 2020-06-01 MED ORDER — PROPOFOL 10 MG/ML IV BOLUS
INTRAVENOUS | Status: DC | PRN
  Administered 2020-06-01: 150 mg via INTRAVENOUS
  Administered 2020-06-01: 50 mg via INTRAVENOUS

## 2020-06-01 MED ORDER — ONDANSETRON HCL 4 MG/2ML IJ SOLN
INTRAMUSCULAR | Status: DC | PRN
  Administered 2020-06-01: 4 mg via INTRAVENOUS

## 2020-06-01 MED ORDER — LACTATED RINGERS IV SOLN: INTRAVENOUS | Status: DC

## 2020-06-01 MED ORDER — PHENYLEPHRINE 40 MCG/ML (10ML) SYRINGE FOR IV PUSH (FOR BLOOD PRESSURE SUPPORT)
PREFILLED_SYRINGE | INTRAVENOUS | Status: AC
  Filled 2020-06-01: qty 10

## 2020-06-01 MED ORDER — FENTANYL CITRATE (PF) 250 MCG/5ML IJ SOLN
INTRAMUSCULAR | Status: AC
  Filled 2020-06-01: qty 5

## 2020-06-01 MED ORDER — ACETAMINOPHEN 500 MG PO TABS
ORAL_TABLET | ORAL | Status: AC
  Administered 2020-06-01: 1000 mg via ORAL
  Filled 2020-06-01: qty 2

## 2020-06-01 MED ORDER — SUCCINYLCHOLINE CHLORIDE 200 MG/10ML IV SOSY
PREFILLED_SYRINGE | INTRAVENOUS | Status: AC
  Filled 2020-06-01: qty 10

## 2020-06-01 SURGICAL SUPPLY — 29 items
BLADE SURG 15 STRL LF DISP TIS (BLADE) ×1 IMPLANT
BLADE SURG 15 STRL SS (BLADE) ×3
CANISTER SUCT 3000ML PPV (MISCELLANEOUS) ×3 IMPLANT
COVER BACK TABLE 60X90IN (DRAPES) ×3 IMPLANT
COVER MAYO STAND STRL (DRAPES) ×3 IMPLANT
COVER WAND RF STERILE (DRAPES) ×3 IMPLANT
DRAPE HALF SHEET 40X57 (DRAPES) ×3 IMPLANT
GAUZE SPONGE 4X4 12PLY STRL (GAUZE/BANDAGES/DRESSINGS) ×3 IMPLANT
GLOVE BIO SURGEON STRL SZ7.5 (GLOVE) ×3 IMPLANT
GOWN STRL REUS W/ TWL LRG LVL3 (GOWN DISPOSABLE) ×1 IMPLANT
GOWN STRL REUS W/TWL LRG LVL3 (GOWN DISPOSABLE) ×3
GUARD TEETH (MISCELLANEOUS) ×3 IMPLANT
KIT BASIN OR (CUSTOM PROCEDURE TRAY) ×3 IMPLANT
KIT PROLARN PLUS GEL W/NDL (Prosthesis and Implant ENT) ×3 IMPLANT
KIT TURNOVER KIT B (KITS) ×3 IMPLANT
NEEDLE HYPO 25GX1X1/2 BEV (NEEDLE) IMPLANT
NS IRRIG 1000ML POUR BTL (IV SOLUTION) ×3 IMPLANT
PAD ARMBOARD 7.5X6 YLW CONV (MISCELLANEOUS) ×6 IMPLANT
PATTIES SURGICAL .5 X1 (DISPOSABLE) ×3 IMPLANT
PATTIES SURGICAL .5 X3 (DISPOSABLE) ×3 IMPLANT
POSITIONER HEAD DONUT 9IN (MISCELLANEOUS) ×3 IMPLANT
SOL ANTI FOG 6CC (MISCELLANEOUS) ×1 IMPLANT
SOLUTION ANTI FOG 6CC (MISCELLANEOUS) ×2
SURGILUBE 2OZ TUBE FLIPTOP (MISCELLANEOUS) ×3 IMPLANT
SUT SILK 2 0 PERMA HAND 18 BK (SUTURE) ×3 IMPLANT
TOWEL GREEN STERILE FF (TOWEL DISPOSABLE) ×6 IMPLANT
TUBE CONNECTING 12'X1/4 (SUCTIONS) ×1
TUBE CONNECTING 12X1/4 (SUCTIONS) ×2 IMPLANT
WATER STERILE IRR 1000ML POUR (IV SOLUTION) ×3 IMPLANT

## 2020-06-01 NOTE — Anesthesia Procedure Notes (Addendum)
Procedure Name: General with mask airway Date/Time: 06/01/2020 11:29 AM Performed by: Reece Agar, CRNA Pre-anesthesia Checklist: Patient identified, Emergency Drugs available, Suction available, Patient being monitored and Timeout performed Patient Re-evaluated:Patient Re-evaluated prior to induction Preoxygenation: Pre-oxygenation with 100% oxygen Comments: Surgeon mask ventilated the pt. After paralytic given, DL performed by surgeon and surgeon used a rigid bronchoscope, jet ventilation then applied.

## 2020-06-01 NOTE — Brief Op Note (Signed)
06/01/2020  11:45 AM  PATIENT:  Brian Branch  79 y.o. male  PRE-OPERATIVE DIAGNOSIS:  Dysphonia  POST-OPERATIVE DIAGNOSIS:  Dysphonia  PROCEDURE:  Procedure(s): SUSPENDED MICRO DIRECT LARYNGOSCOPY WITH PROLARYN INJECTIONS & JET VENTILATION (N/A)  SURGEON:  Surgeon(s) and Role:    * Melida Quitter, MD - Primary  PHYSICIAN ASSISTANT:   ASSISTANTS: none   ANESTHESIA:   general  EBL:  Minimal  BLOOD ADMINISTERED:none  DRAINS: none   LOCAL MEDICATIONS USED:  NONE  SPECIMEN:  No Specimen  DISPOSITION OF SPECIMEN:  N/A  COUNTS:  YES  TOURNIQUET:  * No tourniquets in log *  DICTATION: .Note written in EPIC  PLAN OF CARE: Discharge to home after PACU  PATIENT DISPOSITION:  PACU - hemodynamically stable.   Delay start of Pharmacological VTE agent (>24hrs) due to surgical blood loss or risk of bleeding: no

## 2020-06-01 NOTE — Transfer of Care (Signed)
Immediate Anesthesia Transfer of Care Note  Patient: Brian Branch  Procedure(s) Performed: SUSPENDED MICRO DIRECT LARYNGOSCOPY WITH PROLARYN INJECTIONS & JET VENTILATION (N/A Mouth)  Patient Location: PACU  Anesthesia Type:General  Level of Consciousness: drowsy  Airway & Oxygen Therapy: Patient Spontanous Breathing  Post-op Assessment: Report given to RN, Post -op Vital signs reviewed and stable and Patient moving all extremities  Post vital signs: Reviewed and stable  Last Vitals:  Vitals Value Taken Time  BP 111/65 06/01/20 1200  Temp 36.7 C 06/01/20 1200  Pulse 86 06/01/20 1200  Resp 20 06/01/20 1200  SpO2 95 % 06/01/20 1200  Vitals shown include unvalidated device data.  Last Pain:  Vitals:   06/01/20 0920  TempSrc:   PainSc: 0-No pain         Complications: No complications documented.

## 2020-06-01 NOTE — Op Note (Signed)
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   COMPLICATIONS:  None.   INDICATIONS:  The patient is a 79 year old male with a history of hoarseness that has not responded to anti-reflux therapy and is associated with bowed vocal folds.  He presents to the operating room for surgical management.   FINDINGS:  Normal vocal fold appearance aside from bowing.   DESCRIPTION OF PROCEDURE:  The patient was identified in the holding room, informed consent having been obtained including discussion of risks, benefits and alternatives, the patient was brought to the operative suite and put the operative table in  supine position.  Anesthesia was induced and the patient was maintained via mask ventilation.  The eyes were taped closed and bed was turned 90 degrees from anesthesia.  The patient was given intravenous steroids during the  case.  A tooth guard was placed over the upper teeth and a Stortz laryngoscope was placed into the supraglottic position and suspended to Mayo stand using the Lewy arm.  Jet ventilation was initiated.  Photodocumentation was performed with the zero degree telescope.  Under the operating microscope, Prolaryn was injected into both vocal folds totaling 0.5 cc in the right side and 0.4 cc in the left side with about 3/4 placed posteriorly and 1/4 anteriorly on each side.  Photodocumentation was repeated.  The larynx was sprayed with topical lidocaine.  The laryngoscope was then taking out of suspension and removed from the patient's mouth while suctioning the airway.  The tooth guard was removed and the patient was turned back to anesthesia for wakeup and taken to the recovery room in stable condition.

## 2020-06-01 NOTE — H&P (Signed)
Brian Branch is an 79 y.o. male.   Chief Complaint: Hoarseness HPI: 79 year old male with long history of hoarseness that has not responded to reflux therapy.  To treat vocal fold bowing, he presents to the operating room.  Past Medical History:  Diagnosis Date  . ALLERGIC RHINITIS 02/15/2007  . Allergy   . BENIGN PROSTATIC HYPERTROPHY 02/15/2007  . CAD (coronary artery disease)   . COLONIC POLYPS, HX OF 08/29/2007  . CORONARY ARTERY DISEASE 02/15/2007  . 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 DEPRESSION 08/29/2007   pt denies having depression  . ED (erectile dysfunction)   . FATIGUE 08/29/2007  . GERD (gastroesophageal reflux disease)   . Heart attack ([place])    [place]  . HYPERLIPIDEMIA 02/15/2007  . INSOMNIA-SLEEP DISORDER-UNSPEC 03/09/2010  . KNEE PAIN, LEFT 08/29/2007  . 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 SHINGLES, 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  . TOBACCO ABUSE 03/09/2010  . Unspecified eustachian tube disorder 03/09/2010    Past Surgical History:  Procedure Laterality Date  . APPENDECTOMY  1951  . [place]    . CORONARY ARTERY BYPASS GRAFT  1991  . POLYPECTOMY    . TONSILLECTOMY AND ADENOIDECTOMY    . VASECTOMY  1978    Family History  Problem Relation Age of Onset  . Arthritis Mother        Rhuematoid  . Heart disease Father   . Colon cancer Neg Hx   . Esophageal cancer Neg Hx   . Rectal cancer Neg Hx   . Stomach cancer Neg Hx   . Pancreatic cancer Neg Hx   . Prostate cancer Neg Hx   . Colon polyps Neg Hx    Social History:  reports that he quit smoking about 11 years ago. He has never used smokeless tobacco. He reports current alcohol use of about 2.0 standard drinks of alcohol per week. He reports that he does not use drugs.  Allergies:  Allergies  Allergen Reactions  . Levitra [Vardenafil Hydrochloride]     headache  . Lincomycin Hives  . Penicillins Hives    REACTION: 40 years ago  .  Tadalafil     REACTION: headache  . Triazolam     REACTION: hallucinations  . Vardenafil Nausea And Vomiting    headache    Medications Prior to Admission  Medication Sig Dispense Refill  . acetaminophen (TYLENOL) 500 MG tablet Take 1,000 mg by mouth every 6 (six) hours as needed for mild pain or moderate pain. Rapid release    . aspirin (ASPIRIN LOW DOSE) 81 MG EC tablet TAKE 1 TABLET DAILY (SWALLOW WHOLE) (Patient taking differently: 81 mg daily. (SWALLOW WHOLE)) 90 tablet 3  . Cyanocobalamin (VITAMIN B-12) 5000 MCG SUBL Place 5,000 mcg under the tongue daily.    . diphenhydrAMINE (BENADRYL) 25 mg capsule Take 25 mg by mouth in the morning and at bedtime. Allergies    . fluticasone (FLONASE) 50 MCG/ACT nasal spray Place 2 sprays into both nostrils daily. (Patient taking differently: Place 2 sprays into both nostrils in the morning and at bedtime. ) 48 g 3  . omeprazole (PRILOSEC) 40 MG capsule Take 1 capsule (40 mg total) by mouth 2 (two) times daily. (Patient taking differently: Take 40 mg by mouth daily. ) 180 capsule 3  . rosuvastatin (CRESTOR) 20 MG tablet Take 1 tablet (20 mg total) by mouth daily. (Patient taking differently: Take 20  mg by mouth at bedtime. ) 90 tablet 3  . solifenacin (VESICARE) 5 MG tablet Take 1 tablet (5 mg total) by mouth daily. 90 tablet 3  . tamsulosin (FLOMAX) 0.4 MG CAPS capsule Take 1 capsule (0.4 mg total) by mouth daily. 90 capsule 3  . Vitamin D, Ergocalciferol, (DRISDOL) 1.25 MG (50000 UNIT) CAPS capsule Take 1 capsule (50,000 Units total) by mouth every 7 (seven) days. (Patient taking differently: Take 50,000 Units by mouth every Monday. ) 12 capsule 0    No results found for this or any previous visit (from the past 48 hour(s)). No results found.  Review of Systems  All other systems reviewed and are negative.   Blood pressure 135/68, pulse 74, temperature 98.7 F (37.1 C), temperature source Oral, resp. rate 18, SpO2 99 %. Physical  Exam Constitutional:      Appearance: Normal appearance. He is normal weight.  HENT:     Head: Normocephalic and atraumatic.     Right Ear: External ear normal.     Left Ear: External ear normal.     Nose: Nose normal.     Mouth/Throat:     Mouth: Mucous membranes are moist.     Pharynx: Oropharynx is clear.  Eyes:     Extraocular Movements: Extraocular movements intact.     Conjunctiva/sclera: Conjunctivae normal.     Pupils: Pupils are equal, round, and reactive to light.  Cardiovascular:     Rate and Rhythm: Normal rate.  Pulmonary:     Effort: Pulmonary effort is normal.  Musculoskeletal:     Cervical back: Normal range of motion.  Skin:    General: Skin is warm and dry.  Neurological:     General: No focal deficit present.     Mental Status: He is alert and oriented to person, place, and time.  Psychiatric:        Mood and Affect: Mood normal.        Behavior: Behavior normal.        Thought Content: Thought content normal.        Judgment: Judgment normal.      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  Melida Quitter, MD 06/01/2020, 11:01 AM

## 2020-06-02 ENCOUNTER — Encounter (HOSPITAL_COMMUNITY): Payer: Self-pay | Admitting: Otolaryngology

## 2020-06-02 NOTE — Anesthesia Postprocedure Evaluation (Signed)
Anesthesia Post Note  Patient: BASEL DEFALCO  Procedure(s) Performed: SUSPENDED MICRO DIRECT LARYNGOSCOPY WITH PROLARYN INJECTIONS & JET VENTILATION (N/A Mouth)     Patient location during evaluation: PACU Anesthesia Type: General Level of consciousness: awake and alert Pain management: pain level controlled Vital Signs Assessment: post-procedure vital signs reviewed and stable Respiratory status: spontaneous breathing, nonlabored ventilation, respiratory function stable and patient connected to nasal cannula oxygen Cardiovascular status: blood pressure returned to baseline and stable Postop Assessment: no apparent nausea or vomiting Anesthetic complications: no   No complications documented.  Last Vitals:  Vitals:   06/01/20 1230 06/01/20 1240  BP:  136/63  Pulse: 78 73  Resp: 20 (!) 23  Temp:  36.8 C  SpO2: 97% 95%    Last Pain:  Vitals:   06/01/20 1240  TempSrc:   PainSc: 0-No pain                 Catalina Gravel

## 2020-10-20 ENCOUNTER — Encounter: Payer: Self-pay | Admitting: Gastroenterology

## 2020-12-06 ENCOUNTER — Telehealth: Payer: Self-pay | Admitting: Internal Medicine

## 2020-12-06 NOTE — Telephone Encounter (Signed)
LVM for pt to rtn my call to schedule AWV with [facility]. Please schedule AWV if pt calls the office  

## 2021-03-29 ENCOUNTER — Encounter: Payer: Self-pay | Admitting: Internal Medicine

## 2021-05-09 ENCOUNTER — Encounter: Payer: Self-pay | Admitting: Internal Medicine

## 2021-05-09 ENCOUNTER — Other Ambulatory Visit: Payer: Self-pay

## 2021-05-09 ENCOUNTER — Ambulatory Visit (INDEPENDENT_AMBULATORY_CARE_PROVIDER_SITE_OTHER): Payer: Medicare Other | Admitting: Internal Medicine

## 2021-05-09 VITALS — BP 114/68 | HR 65 | Temp 98.2°F | Ht 66.0 in | Wt 159.8 lb

## 2021-05-09 DIAGNOSIS — Z0000 Encounter for general adult medical examination without abnormal findings: Secondary | ICD-10-CM

## 2021-05-09 DIAGNOSIS — R739 Hyperglycemia, unspecified: Secondary | ICD-10-CM | POA: Diagnosis not present

## 2021-05-09 DIAGNOSIS — E785 Hyperlipidemia, unspecified: Secondary | ICD-10-CM

## 2021-05-09 DIAGNOSIS — E538 Deficiency of other specified B group vitamins: Secondary | ICD-10-CM

## 2021-05-09 DIAGNOSIS — Z1211 Encounter for screening for malignant neoplasm of colon: Secondary | ICD-10-CM

## 2021-05-09 DIAGNOSIS — Z0001 Encounter for general adult medical examination with abnormal findings: Secondary | ICD-10-CM

## 2021-05-09 DIAGNOSIS — E559 Vitamin D deficiency, unspecified: Secondary | ICD-10-CM | POA: Diagnosis not present

## 2021-05-09 LAB — BASIC METABOLIC PANEL
BUN: 19 mg/dL (ref 6–23)
CO2: 28 mEq/L (ref 19–32)
Calcium: 9.5 mg/dL (ref 8.4–10.5)
Chloride: 101 mEq/L (ref 96–112)
Creatinine, Ser: 1.13 mg/dL (ref 0.40–1.50)
GFR: 61.93 mL/min (ref >60.00)
Glucose, Bld: 106 mg/dL — ABNORMAL HIGH (ref 70–99)
Potassium: 4.9 mEq/L (ref 3.5–5.1)
Sodium: 138 mEq/L (ref 135–145)

## 2021-05-09 LAB — URINALYSIS, ROUTINE W REFLEX MICROSCOPIC
Bilirubin Urine: NEGATIVE
Hgb urine dipstick: NEGATIVE
Ketones, ur: NEGATIVE
Leukocytes,Ua: NEGATIVE
Nitrite: NEGATIVE
RBC / HPF: NONE SEEN (ref 0–?)
Specific Gravity, Urine: 1.01 (ref 1.000–1.030)
Total Protein, Urine: NEGATIVE
Urine Glucose: NEGATIVE
Urobilinogen, UA: 0.2 (ref 0.0–1.0)
pH: 6 (ref 5.0–8.0)

## 2021-05-09 LAB — CBC WITH DIFFERENTIAL/PLATELET
Basophils Absolute: 0.1 10*3/uL (ref 0.0–0.1)
Basophils Relative: 0.7 % (ref 0.0–3.0)
Eosinophils Absolute: 0.5 10*3/uL (ref 0.0–0.7)
Eosinophils Relative: 5 % (ref 0.0–5.0)
HCT: 43 % (ref 39.0–52.0)
Hemoglobin: 14.5 g/dL (ref 13.0–17.0)
Lymphocytes Relative: 14.6 % (ref 12.0–46.0)
Lymphs Abs: 1.4 10*3/uL (ref 0.7–4.0)
MCHC: 33.7 g/dL (ref 30.0–36.0)
MCV: 93.2 fl (ref 78.0–100.0)
Monocytes Absolute: 0.7 10*3/uL (ref 0.1–1.0)
Monocytes Relative: 7.8 % (ref 3.0–12.0)
Neutro Abs: 6.6 10*3/uL (ref 1.4–7.7)
Neutrophils Relative %: 71.9 % (ref 43.0–77.0)
Platelets: 222 10*3/uL (ref 150.0–400.0)
RBC: 4.62 Mil/uL (ref 4.22–5.81)
RDW: 12.9 % (ref 11.5–15.5)
WBC: 9.2 10*3/uL (ref 4.0–10.5)

## 2021-05-09 LAB — LIPID PANEL
Cholesterol: 110 mg/dL (ref 0–200)
HDL: 30 mg/dL — ABNORMAL LOW (ref >39.00)
LDL Cholesterol: 42 mg/dL (ref 0–99)
NonHDL: 80.16
Total CHOL/HDL Ratio: 4
Triglycerides: 191 mg/dL — ABNORMAL HIGH (ref 0.0–149.0)
VLDL: 38.2 mg/dL (ref 0.0–40.0)

## 2021-05-09 LAB — HEMOGLOBIN A1C: Hgb A1c MFr Bld: 6.2 % (ref 4.6–6.5)

## 2021-05-09 LAB — VITAMIN B12: Vitamin B-12: >1550 pg/mL — ABNORMAL HIGH (ref 211–911)

## 2021-05-09 LAB — HEPATIC FUNCTION PANEL
ALT: 17 U/L (ref 0–53)
AST: 19 U/L (ref 0–37)
Albumin: 4.4 g/dL (ref 3.5–5.2)
Alkaline Phosphatase: 43 U/L (ref 39–117)
Bilirubin, Direct: 0.2 mg/dL (ref 0.0–0.3)
Total Bilirubin: 0.8 mg/dL (ref 0.2–1.2)
Total Protein: 7 g/dL (ref 6.0–8.3)

## 2021-05-09 LAB — PSA: PSA: 1.73 ng/mL (ref 0.10–4.00)

## 2021-05-09 LAB — TSH: TSH: 1.77 u[IU]/mL (ref 0.35–5.50)

## 2021-05-09 LAB — VITAMIN D 25 HYDROXY (VIT D DEFICIENCY, FRACTURES): VITD: 48.19 ng/mL (ref 30.00–100.00)

## 2021-05-09 MED ORDER — OMEPRAZOLE 40 MG PO CPDR
40.0000 mg | DELAYED_RELEASE_CAPSULE | Freq: Every day | ORAL | 3 refills | Status: DC
Start: 2021-05-09 — End: 2021-05-18

## 2021-05-09 MED ORDER — ASPIRIN 81 MG PO TBEC: DELAYED_RELEASE_TABLET | ORAL | 3 refills | Status: DC

## 2021-05-09 MED ORDER — ROSUVASTATIN CALCIUM 20 MG PO TABS
20.0000 mg | ORAL_TABLET | Freq: Every day | ORAL | 3 refills | Status: DC
Start: 2021-05-09 — End: 2022-05-10

## 2021-05-09 MED ORDER — CHOLECALCIFEROL 50 MCG (2000 UT) PO TABS: ORAL_TABLET | ORAL | 3 refills | Status: AC

## 2021-05-09 MED ORDER — TAMSULOSIN HCL 0.4 MG PO CAPS
0.4000 mg | ORAL_CAPSULE | Freq: Every day | ORAL | 3 refills | Status: DC
Start: 2021-05-09 — End: 2022-05-10

## 2021-05-09 MED ORDER — SOLIFENACIN SUCCINATE 5 MG PO TABS
5.0000 mg | ORAL_TABLET | Freq: Every day | ORAL | 3 refills | Status: DC
Start: 2021-05-09 — End: 2022-05-10

## 2021-05-09 MED ORDER — FLUTICASONE PROPIONATE 50 MCG/ACT NA SUSP: 2.0000 | Freq: Two times a day (BID) | NASAL | 3 refills | Status: DC

## 2021-05-09 NOTE — Progress Notes (Signed)
Chief Complaint:: wellness exam and low vit d       HPI:  Brian Branch is a 80 y.o. male here for wellness exam; pt ok for cologuard - does not want colonsocpy delciens shignrix, o/w up to date                        Also not takign vist d.  Pt denies chest pain, increased sob or doe, wheezing, orthopnea, PND, increased LE swelling, palpitations, dizziness or syncope.   Wt Readings from Last 3 Encounters:  05/09/21 159 lb 12.8 oz (72.5 kg)  05/26/20 164 lb 8 oz (74.6 kg)  05/06/20 161 lb (73 kg)   BP Readings from Last 3 Encounters:  05/09/21 114/68  06/01/20 136/63  05/26/20 (!) 147/81   Immunization History  Administered Date(s) Administered   Fluad Quad(high Dose 65+) 05/06/2019, 05/06/2020   Influenza Split 04/13/2011, 04/09/2012   Influenza Whole 04/12/2010   Influenza, High Dose Seasonal PF 04/26/2017, 04/30/2018   Influenza,inj,Quad PF,6+ Mos 04/10/2013, 04/16/2014, 04/22/2015, 04/25/2016   Influenza-Unspecified 05/01/2021   PFIZER(Purple Top)SARS-COV-2 Vaccination 08/11/2019, 08/31/2019, 05/02/2020   Pfizer Covid-19 Vaccine Bivalent Booster 54yrs & up 04/10/2021   Pneumococcal Conjugate-13 04/30/2014   Pneumococcal Polysaccharide-23 03/08/2009   Td 03/08/2009   Tdap 05/06/2020   Zoster, Live 04/09/2012   There are no preventive care reminders to display for this patient.     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   DEPRESSION 08/29/2007   pt denies having depression   ED (erectile dysfunction)    FATIGUE 08/29/2007   GERD (gastroesophageal reflux disease)    Heart attack ([place])    [place] 02/15/2007   INSOMNIA-SLEEP DISORDER-UNSPEC 03/09/2010   KNEE PAIN, LEFT 08/29/2007   MYOCARDIAL INFARCTION,  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   SHINGLES, 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   TOBACCO ABUSE 03/09/2010   Unspecified eustachian tube disorder 03/09/2010   Past Surgical History:  Procedure Laterality Date   [place]   COLONOSCOPY     CORONARY ARTERY BYPASS GRAFT  1991   MICROLARYNGOSCOPY N/A 06/01/2020   Procedure: SUSPENDED MICRO DIRECT LARYNGOSCOPY WITH PROLARYN INJECTIONS & JET VENTILATION;  Surgeon: Melida Quitter, MD;  Location: [facility];  Service: ENT;  Laterality: N/A;   [place]    reports that he quit smoking about 12 years ago. His smoking use included cigarettes. He has never used smokeless tobacco. He reports current alcohol use of about 2.0 standard drinks per week. He reports that he does not use drugs. family history includes Arthritis in his mother; Heart disease in his father. Allergies  Allergen Reactions   Levitra [Vardenafil Hydrochloride]     headache   Lincomycin Hives   Penicillins Hives    REACTION: 40 years ago   Tadalafil     REACTION: headache   Triazolam     REACTION: hallucinations   Vardenafil Nausea And Vomiting    headache   Current Outpatient Medications on File Prior to Visit  Medication Sig Dispense Refill   acetaminophen (TYLENOL)  500 MG tablet Take 1,000 mg by mouth every 6 (six) hours as needed for mild pain or moderate pain. Rapid release     Cyanocobalamin (VITAMIN B-12) 5000 MCG SUBL Place 5,000 mcg under the tongue daily.     diphenhydrAMINE (BENADRYL) 25 mg capsule Take 25 mg by mouth in the morning and at bedtime. Allergies     No current facility-administered medications on file prior to visit.        ROS:  All others reviewed and negative.  Objective        PE:  BP 114/68 (BP Location: Right Arm, Patient Position: Sitting, Cuff Size: Large)   Pulse 65   Temp 98.2 F (36.8 C) (Oral)   Ht 5\' 6"  (1.676 m)   Wt 159 lb 12.8 oz (72.5 kg)   SpO2  97%   BMI 25.79 kg/m                 Constitutional: Pt appears in NAD               HENT: Head: NCAT.                Right Ear: External ear normal.                 Left Ear: External ear normal.                Eyes: . Pupils are equal, round, and reactive to light. Conjunctivae and EOM are normal               Nose: without d/c or deformity               Neck: Neck supple. Gross normal ROM               Cardiovascular: Normal rate and regular rhythm.                 Pulmonary/Chest: Effort normal and breath sounds without rales or wheezing.                Abd:  Soft, NT, ND, + BS, no organomegaly               Neurological: Pt is alert. At baseline orientation, motor grossly intact               Skin: Skin is warm. No rashes, no other new lesions, LE edema - none               Psychiatric: Pt behavior is normal without agitation   Micro: none  Cardiac tracings I have personally interpreted today:  none  Pertinent Radiological findings (summarize): none   Lab Results  Component Value Date   WBC 9.2 05/09/2021   HGB 14.5 05/09/2021   HCT 43.0 05/09/2021   PLT 222.0 05/09/2021   GLUCOSE 106 (H) 05/09/2021   CHOL 110 05/09/2021   TRIG 191.0 (H) 05/09/2021   HDL 30.00 (L) 05/09/2021   LDLDIRECT 72.0 04/30/2018   LDLCALC 42 05/09/2021   ALT 17 05/09/2021   AST 19 05/09/2021   NA 138 05/09/2021   K 4.9 05/09/2021   CL 101 05/09/2021   CREATININE 1.13 05/09/2021   BUN 19 05/09/2021   CO2 28 05/09/2021   TSH 1.77 05/09/2021   PSA 1.73 05/09/2021   HGBA1C 6.2 05/09/2021   Assessment/Plan:  Brian Branch is a 80 y.o. White or Caucasian [1] male with  has a past medical history of ALLERGIC  RHINITIS (02/15/2007), Allergy, BENIGN PROSTATIC HYPERTROPHY (02/15/2007), CAD (coronary artery disease), COLONIC POLYPS, HX OF (08/29/2007), CORONARY ARTERY DISEASE (02/15/2007), DEGENERATIVE JOINT DISEASE, CERVICAL SPINE (02/15/2007), DEPRESSION (08/29/2007), ED (erectile dysfunction), FATIGUE  (08/29/2007), GERD (gastroesophageal reflux disease), Heart attack ([place]), HYPERLIPIDEMIA (02/15/2007), INSOMNIA-SLEEP DISORDER-UNSPEC (03/09/2010), KNEE PAIN, LEFT (08/29/2007), MYOCARDIAL INFARCTION, HX OF (02/15/2007), SHINGLES, HX OF (02/15/2007), TOBACCO ABUSE (03/09/2010), and Unspecified eustachian tube disorder (03/09/2010).  Vitamin D deficiency Last vitamin D Lab Results  Component Value Date   VD25OH 23.11 (L) 05/06/2020   Low, to start oral replacement   B12 deficiency Lab Results  Component Value Date   VITAMINB12 >1550 (H) 05/09/2021   Stable, cont oral replacement - b12 1000 mcg qd   Hyperglycemia Lab Results  Component Value Date   HGBA1C 6.2 05/09/2021   Stable, pt to continue current medical treatment  -diet   Hyperlipidemia Lab Results  Component Value Date   LDLCALC 42 05/09/2021   Stable, pt to continue current statin crestor 20   Preventative health care Age and sex appropriate education and counseling updated with regular exercise and diet Referrals for preventative services - for cologuard Immunizations addressed - declines shingrix Smoking counseling  - none needed Evidence for depression or other mood disorder - none significant Most recent labs reviewed. I have personally reviewed and have noted: 1) the patient's medical and social history 2) The patient's current medications and supplements 3) The patient's height, weight, and BMI have been recorded in the chart  Followup: Return in about 1 year (around 05/09/2022).  [name], MD 05/14/2021 10:27 PM [facility]

## 2021-05-09 NOTE — Assessment & Plan Note (Signed)
Last vitamin D Lab Results  Component Value Date   VD25OH 23.11 (L) 05/06/2020   Low, to start oral replacement

## 2021-05-09 NOTE — Patient Instructions (Addendum)
You will be contacted regarding the referral for: cologuard  Please remember to consider the Novavax covid vaccine instead of more boosters for the future  Please take OTC Vitamin D3 at 2000 units per day, indefinitely, or 4000 units if you already take the 2000 units.   Please continue all other medications as before, and refills have been done if requested ([facility])  Please have the pharmacy call with any other refills you may need.  Please continue your efforts at being more active, low cholesterol diet, and weight control.  You are otherwise up to date with prevention measures today.  Please keep your appointments with your specialists as you may have planned  Please go to the LAB at the blood drawing area for the tests to be done  You will be contacted by phone if any changes need to be made immediately.  Otherwise, you will receive a letter about your results with an explanation, but please check with MyChart first.  Please remember to sign up for MyChart if you have not done so, as this will be important to you in the future with finding out test results, communicating by private email, and scheduling acute appointments online when needed.  Please make an Appointment to return for your 1 year visit, or sooner if needed

## 2021-05-14 ENCOUNTER — Encounter: Payer: Self-pay | Admitting: Internal Medicine

## 2021-05-14 NOTE — Assessment & Plan Note (Signed)
Lab Results  Component Value Date   HGBA1C 6.2 05/09/2021   Stable, pt to continue current medical treatment  -diet

## 2021-05-14 NOTE — Assessment & Plan Note (Signed)
Age and sex appropriate education and counseling updated with regular exercise and diet Referrals for preventative services - for cologuard Immunizations addressed - declines shingrix Smoking counseling  - none needed Evidence for depression or other mood disorder - none significant Most recent labs reviewed. I have personally reviewed and have noted: 1) the patient's medical and social history 2) The patient's current medications and supplements 3) The patient's height, weight, and BMI have been recorded in the chart

## 2021-05-14 NOTE — Assessment & Plan Note (Signed)
Lab Results  Component Value Date   VITAMINB12 >1550 (H) 05/09/2021   Stable, cont oral replacement - b12 1000 mcg qd

## 2021-05-14 NOTE — Assessment & Plan Note (Signed)
Lab Results  Component Value Date   LDLCALC 42 05/09/2021   Stable, pt to continue current statin crestor 20

## 2021-05-18 ENCOUNTER — Encounter: Payer: Self-pay | Admitting: Internal Medicine

## 2021-05-18 MED ORDER — OMEPRAZOLE 40 MG PO CPDR
40.0000 mg | DELAYED_RELEASE_CAPSULE | Freq: Two times a day (BID) | ORAL | 3 refills | Status: DC
Start: 2021-05-18 — End: 2022-05-10

## 2021-05-24 ENCOUNTER — Encounter: Payer: Self-pay | Admitting: Internal Medicine

## 2021-05-24 LAB — COLOGUARD: COLOGUARD: NEGATIVE

## 2021-09-20 ENCOUNTER — Encounter: Payer: Self-pay | Admitting: Internal Medicine

## 2021-10-24 NOTE — Progress Notes (Signed)
?Charlann Boxer D.O. ?[facility] ?[place] ?Phone: [phone] ?Subjective:   ?I, Brian Branch, am serving as a scribe for Dr. Hulan Saas. ? ?This visit occurred during the SARS-CoV-2 public health emergency.  Safety protocols were in place, including screening questions prior to the visit, additional usage of staff PPE, and extensive cleaning of exam room while observing appropriate contact time as indicated for disinfecting solutions.  ? ?I'm seeing this patient by the request  of:  Biagio Borg, MD ? ?CC: right hip pain  ? ?[id number]  ?Seen in 2017 for LBP and 2014 for right hip pain ? ?Brian Branch is a 81 y.o. male coming in with complaint of right hip pain. Picked up a pail recently had sharp pain in lateral aspect of R hip. Patient also has pain that will radiate into R thigh. Pain is worse after sitting for a while.  ? ?Patient did have x-rays in 2014 of the right hip that were independently visualized by me.  Patient did have moderate osteoarthritic changes of the joints at that time.  In 2017 patient did have a x-rays of the lumbar spine showing degenerative changes throughout the lumbar spine as well. ? ?Also c/o pain in L shoulder throughout the joint. No pain today but if he uses his L arm at times he will have a sharp pain. Denies any numbness or tingling. not having radiating symptoms.  ? ?  ? ?Past Medical History:  ?Diagnosis Date  ? ALLERGIC RHINITIS 02/15/2007  ? Allergy   ? BENIGN PROSTATIC HYPERTROPHY 02/15/2007  ? CAD (coronary artery disease)   ? COLONIC POLYPS, HX OF 08/29/2007  ? CORONARY ARTERY DISEASE 02/15/2007  ? 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 DEPRESSION 08/29/2007  ? pt denies having depression  ? ED (erectile dysfunction)   ? FATIGUE 08/29/2007  ? GERD (gastroesophageal reflux disease)   ? Heart attack ([place])   ? [place]  ? HYPERLIPIDEMIA 02/15/2007  ? INSOMNIA-SLEEP DISORDER-UNSPEC 03/09/2010  ? KNEE PAIN, LEFT  08/29/2007  ? 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 SHINGLES, 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  ? TOBACCO ABUSE 03/09/2010  ? Unspecified eustachian tube disorder 03/09/2010  ? ?Past Surgical History:  ?Procedure Laterality Date  ? APPENDECTOMY  1951  ? CARDIAC CATHETERIZATION    ? [place]  ? COLONOSCOPY    ? CORONARY ARTERY BYPASS GRAFT  1991  ? MICROLARYNGOSCOPY N/A 06/01/2020  ? Procedure: SUSPENDED MICRO DIRECT LARYNGOSCOPY WITH PROLARYN INJECTIONS & JET VENTILATION;  Surgeon: Melida Quitter, MD;  Location: [facility];  Service: ENT;  Laterality: N/A;  ? POLYPECTOMY    ? TONSILLECTOMY AND ADENOIDECTOMY    ? VASECTOMY  1978  ? ?Social History  ? ?Socioeconomic History  ? Marital status: Married  ?  Spouse name: Not on file  ? Number of children: 1  ? Years of education: Not on file  ? Highest education level: Not on file  ?Occupational History  ? Not on file  ?Tobacco Use  ? Smoking status: Former  ?  Types: Cigarettes  ?  Quit date: 07/23/2008  ?  Years since quitting: 13.2  ? Smokeless tobacco: Never  ?Vaping Use  ? Vaping Use: Never used  ?Substance and Sexual Activity  ? Alcohol use: Yes  ?  Alcohol/week: 2.0 standard drinks  ?  Types: 2 Cans of beer per week  ?  Comment: occassionally I will drink about 2 beers  ?  Drug use: No  ? Sexual activity: Not Currently  ?Other Topics Concern  ? Not on file  ?Social History Narrative  ? Not on file  ? ?Social Determinants of Health  ? ?Financial Resource Strain: Not on file  ?Food Insecurity: Not on file  ?Transportation Needs: Not on file  ?Physical Activity: Not on file  ?Stress: Not on file  ?Social Connections: Not on file  ? ?Allergies  ?Allergen Reactions  ? Levitra [Vardenafil Hydrochloride]   ?  headache  ? Lincomycin Hives  ? Penicillins Hives  ?  REACTION: 40 years ago  ? Tadalafil   ?  REACTION: headache  ? Triazolam   ?  REACTION: hallucinations  ? Vardenafil Nausea And Vomiting  ?  headache  ? ?Family History  ?Problem Relation Age of Onset  ? Arthritis Mother   ?      Rhuematoid  ? Heart disease Father   ? Colon cancer Neg Hx   ? Esophageal cancer Neg Hx   ? Rectal cancer Neg Hx   ? Stomach cancer Neg Hx   ? Pancreatic cancer Neg Hx   ? Prostate cancer Neg Hx   ? Colon polyps Neg Hx   ? ? ? ?Current Outpatient Medications (Cardiovascular):  ?  rosuvastatin (CRESTOR) 20 MG tablet, Take 1 tablet (20 mg total) by mouth at bedtime. ? ?Current Outpatient Medications (Respiratory):  ?  diphenhydrAMINE (BENADRYL) 25 mg capsule, Take 25 mg by mouth in the morning and at bedtime. Allergies ?  fluticasone (FLONASE) 50 MCG/ACT nasal spray, Place 2 sprays into both nostrils in the morning and at bedtime. ? ?Current Outpatient Medications (Analgesics):  ?  acetaminophen (TYLENOL) 500 MG tablet, Take 1,000 mg by mouth every 6 (six) hours as needed for mild pain or moderate pain. Rapid release ?  aspirin (ASPIRIN LOW DOSE) 81 MG EC tablet, TAKE 1 TABLET DAILY (SWALLOW WHOLE) ? ?Current Outpatient Medications (Hematological):  ?  Cyanocobalamin (VITAMIN B-12) 5000 MCG SUBL, Place 5,000 mcg under the tongue daily. ? ?Current Outpatient Medications (Other):  ?  Cholecalciferol 50 MCG (2000 UT) TABS, 1 tab by mouth once daily ?  omeprazole (PRILOSEC) 40 MG capsule, Take 1 capsule (40 mg total) by mouth in the morning and at bedtime. ?  solifenacin (VESICARE) 5 MG tablet, Take 1 tablet (5 mg total) by mouth daily. ?  tamsulosin (FLOMAX) 0.4 MG CAPS capsule, Take 1 capsule (0.4 mg total) by mouth daily. ? ? ?Reviewed prior external information including notes and imaging from  ?primary care provider ?As well as notes that were available from care everywhere and other healthcare systems. ? ?Past medical history, social, surgical and family history all reviewed in electronic medical record.  No pertanent information unless stated regarding to the chief complaint.  ? ?Review of Systems: ? No headache, visual changes, nausea, vomiting, diarrhea, constipation, dizziness, abdominal pain, skin rash,  fevers, chills, night sweats, weight loss, swollen lymph nodes, body aches, joint swelling, chest pain, shortness of breath, mood changes. POSITIVE muscle aches ? ?Objective  ?Blood pressure 110/62, pulse (!) 58, height '5\' 5"'$  (1.651 m), SpO2 96 %. ?  ?General: No apparent distress alert and oriented x3 mood and affect normal, dressed appropriately.  ?HEENT: Pupils equal, extraocular movements intact  ?Respiratory: Patient's speak in full sentences and does not appear short of breath  ?Cardiovascular: No lower extremity edema, non tender, no erythema  ?Gait normal with good balance and coordination.  ?MSK: Right hip exam does have significant decrease  in internal and external range of motion.  Back exam very mild loss of lordosis.  Some tenderness to palpation more in the sacroiliac joint and minorly over the greater trochanteric area. ? ?  ?Impression and Recommendations:  ?  ? ?The above documentation has been reviewed and is accurate and complete Lyndal Pulley, DO ? ? ? ?

## 2021-10-25 ENCOUNTER — Ambulatory Visit (INDEPENDENT_AMBULATORY_CARE_PROVIDER_SITE_OTHER): Payer: Medicare Other

## 2021-10-25 ENCOUNTER — Ambulatory Visit: Payer: Self-pay

## 2021-10-25 ENCOUNTER — Encounter: Payer: Self-pay | Admitting: Family Medicine

## 2021-10-25 ENCOUNTER — Ambulatory Visit: Payer: Medicare Other | Admitting: Family Medicine

## 2021-10-25 VITALS — BP 110/62 | HR 58 | Ht 65.0 in

## 2021-10-25 DIAGNOSIS — M25551 Pain in right hip: Secondary | ICD-10-CM

## 2021-10-25 DIAGNOSIS — M25512 Pain in left shoulder: Secondary | ICD-10-CM

## 2021-10-25 DIAGNOSIS — M545 Low back pain, unspecified: Secondary | ICD-10-CM | POA: Diagnosis not present

## 2021-10-25 DIAGNOSIS — M1611 Unilateral primary osteoarthritis, right hip: Secondary | ICD-10-CM | POA: Diagnosis not present

## 2021-10-25 NOTE — Patient Instructions (Addendum)
Good to see you ?PT [place] Hip and shoulder ?Turmeric '500mg'$  daily  ?Tart cherry extract '1200mg'$  at night ?Vitamin D 2000 IU daily  ?See me again in 7-8 weeks ? ? ?

## 2021-10-26 NOTE — Assessment & Plan Note (Signed)
Patient did have x-rays and does show worsening hip arthritis at this moment.  Overread is pending.  Discussed with patient about icing regimen, home exercises, formal physical therapy which I think will be beneficial.  Worsening pain can consider intra-articular injection.  Patient does have a very large bone spur noted on the anterior lateral aspect of the hip that is likely contributing to some impingement.  Due to the severity of the R hip arthritis I think any surgeon would be considering a hip replacement.  Patient wants to avoid that at this moment.  Worsening pain we can discuss medications at follow-up in 6 to 8 weeks ?

## 2021-10-30 ENCOUNTER — Ambulatory Visit (INDEPENDENT_AMBULATORY_CARE_PROVIDER_SITE_OTHER): Payer: Medicare Other

## 2021-10-30 DIAGNOSIS — Z0000 Encounter for general adult medical examination without abnormal findings: Secondary | ICD-10-CM | POA: Diagnosis not present

## 2021-10-30 NOTE — Progress Notes (Signed)
? ?Subjective:  ? Brian Branch is a 81 y.o. male who presents for an Subsequent Medicare Annual Wellness Visit. ? ?I connected with Brian Branch  today by telephone and verified that I am speaking with the correct person using two identifiers. ?Location patient: home ?Location provider: work ?Persons participating in the virtual visit: patient, provider. ?  ?I discussed the limitations, risks, security and privacy concerns of performing an evaluation and management service by telephone and the availability of in person appointments. I also discussed with the patient that there may be a patient responsible charge related to this service. The patient expressed understanding and verbally consented to this telephonic visit.  ?  ?Interactive audio and video telecommunications were attempted between this provider and patient, however failed, due to patient having technical difficulties OR patient did not have access to video capability.  We continued and completed visit with audio only. ? ?  ?Review of Systems    ? ?Cardiac Risk Factors include: advanced age (>33mn, >>17women);male gender ? ?   ?Objective:  ?  ?Today's Vitals  ? ?There is no height or weight on file to calculate BMI. ? ? ?  10/30/2021  ? 10:22 AM 05/26/2020  ? 10:29 AM 11/11/2019  ?  2:57 PM 11/06/2018  ?  2:36 PM  ?Advanced Directives  ?Does Patient Have a Medical Advance Directive? Yes Yes Yes Yes  ?Type of AParamedicof AF[place]Living will [place]uck[place]Living will HF[place]ardoLiving will [place]allingfordLiving will  ?Does patient want to make changes to medical advance directive?   No - Patient declined   ?Copy of [place] Chart? No - copy requested No - copy requested No - copy requested No - copy requested  ? ? ?Current Medications (verified) ?Outpatient Encounter Medications as of 10/30/2021  ?Medication Sig  ? acetaminophen (TYLENOL) 500 MG tablet Take  1,000 mg by mouth every 6 (six) hours as needed for mild pain or moderate pain. Rapid release  ? aspirin (ASPIRIN LOW DOSE) 81 MG EC tablet TAKE 1 TABLET DAILY (SWALLOW WHOLE)  ? Cholecalciferol 50 MCG (2000 UT) TABS 1 tab by mouth once daily  ? Cyanocobalamin (VITAMIN B-12) 5000 MCG SUBL Place 5,000 mcg under the tongue daily.  ? diphenhydrAMINE (BENADRYL) 25 mg capsule Take 25 mg by mouth in the morning and at bedtime. Allergies  ? fluticasone (FLONASE) 50 MCG/ACT nasal spray Place 2 sprays into both nostrils in the morning and at bedtime.  ? omeprazole (PRILOSEC) 40 MG capsule Take 1 capsule (40 mg total) by mouth in the morning and at bedtime.  ? rosuvastatin (CRESTOR) 20 MG tablet Take 1 tablet (20 mg total) by mouth at bedtime.  ? solifenacin (VESICARE) 5 MG tablet Take 1 tablet (5 mg total) by mouth daily.  ? tamsulosin (FLOMAX) 0.4 MG CAPS capsule Take 1 capsule (0.4 mg total) by mouth daily.  ? ?No facility-administered encounter medications on file as of 10/30/2021.  ? ? ?Allergies (verified) ?Levitra [vardenafil hydrochloride], Lincomycin, Penicillins, Tadalafil, Triazolam, and Vardenafil  ? ?History: ?Past Medical History:  ?Diagnosis Date  ? ALLERGIC RHINITIS 02/15/2007  ? Allergy   ? BENIGN PROSTATIC HYPERTROPHY 02/15/2007  ? CAD (coronary artery disease)   ? COLONIC POLYPS, HX OF 08/29/2007  ? CORONARY ARTERY DISEASE 02/15/2007  ? 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 DEPRESSION 08/29/2007  ? pt denies having depression  ? ED (erectile dysfunction)   ? FATIGUE  08/29/2007  ? GERD (gastroesophageal reflux disease)   ? Heart attack ([place])   ? [place]  ? HYPERLIPIDEMIA 02/15/2007  ? INSOMNIA-SLEEP DISORDER-UNSPEC 03/09/2010  ? KNEE PAIN, LEFT 08/29/2007  ? 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 SHINGLES, 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  ? TOBACCO ABUSE 03/09/2010  ? Unspecified eustachian tube disorder 03/09/2010  ? ?Past Surgical History:  ?Procedure Laterality Date  ? APPENDECTOMY  1951  ? CARDIAC  CATHETERIZATION    ? [place]  ? COLONOSCOPY    ? CORONARY ARTERY BYPASS GRAFT  1991  ? MICROLARYNGOSCOPY N/A 06/01/2020  ? Procedure: SUSPENDED MICRO DIRECT LARYNGOSCOPY WITH PROLARYN INJECTIONS & JET VENTILATION;  Surgeon: Melida Quitter, MD;  Location: [facility];  Service: ENT;  Laterality: N/A;  ? POLYPECTOMY    ? TONSILLECTOMY AND ADENOIDECTOMY    ? VASECTOMY  1978  ? ?Family History  ?Problem Relation Age of Onset  ? Arthritis Mother   ?     Rhuematoid  ? Heart disease Father   ? Colon cancer Neg Hx   ? Esophageal cancer Neg Hx   ? Rectal cancer Neg Hx   ? Stomach cancer Neg Hx   ? Pancreatic cancer Neg Hx   ? Prostate cancer Neg Hx   ? Colon polyps Neg Hx   ? ?Social History  ? ?Socioeconomic History  ? Marital status: Married  ?  Spouse name: Not on file  ? Number of children: 1  ? Years of education: Not on file  ? Highest education level: Not on file  ?Occupational History  ? Not on file  ?Tobacco Use  ? Smoking status: Former  ?  Types: Cigarettes  ?  Quit date: 07/23/2008  ?  Years since quitting: 13.2  ? Smokeless tobacco: Never  ?Vaping Use  ? Vaping Use: Never used  ?Substance and Sexual Activity  ? Alcohol use: Yes  ?  Alcohol/week: 2.0 standard drinks  ?  Types: 2 Cans of beer per week  ?  Comment: occassionally I will drink about 2 beers  ? Drug use: No  ? Sexual activity: Not Currently  ?Other Topics Concern  ? Not on file  ?Social History Narrative  ? Not on file  ? ?Social Determinants of Health  ? ?Financial Resource Strain: Low Risk   ? Difficulty of Paying Living Expenses: Not hard at all  ?Food Insecurity: No Food Insecurity  ? Worried About Charity fundraiser in the Last Year: Never true  ? Ran Out of Food in the Last Year: Never true  ?Transportation Needs: No Transportation Needs  ? Lack of Transportation (Medical): No  ? Lack of Transportation (Non-Medical): No  ?Physical Activity: Inactive  ? Days of Exercise per Week: 0 days  ? Minutes of Exercise per Session: 0 min  ?Stress: No  Stress Concern Present  ? Feeling of Stress : Not at all  ?Social Connections: Moderately Isolated  ? Frequency of Communication with Friends and Family: Twice a week  ? Frequency of Social Gatherings with Friends and Family: Twice a week  ? Attends Religious Services: Never  ? Active Member of Clubs or Organizations: No  ? Attends Archivist Meetings: Never  ? Marital Status: Married  ? ? ?Tobacco Counseling ?Counseling given: Not Answered ? ? ?Clinical Intake: ? ?Pre-visit preparation completed: Yes ? ?Pain : No/denies pain ? ?  ? ?Nutritional Risks: None ?Diabetes: No ? ?How often do you need to have someone help you  when you read instructions, pamphlets, or other written materials from your doctor or pharmacy?: 1 - Never ?What is the last grade level you completed in school?: GED ? ?Diabetic?no  ? ?Interpreter Needed?: No ? ?Information entered by :: [email or web address] ? ? ?Activities of Daily Living ? ?  10/30/2021  ? 10:25 AM  ?In your present state of health, do you have any difficulty performing the following activities:  ?Hearing? 0  ?Vision? 0  ?Difficulty concentrating or making decisions? 0  ?Walking or climbing stairs? 0  ?Dressing or bathing? 0  ?Doing errands, shopping? 0  ?Preparing Food and eating ? N  ?Using the Toilet? N  ?In the past six months, have you accidently leaked urine? N  ?Do you have problems with loss of bowel control? N  ?Managing your Medications? N  ?Managing your Finances? N  ?Housekeeping or managing your Housekeeping? N  ? ? ?Patient Care Team: ?Biagio Borg, MD as PCP - General ?Lyndal Pulley, DO as Consulting Physician (Family Medicine) ?Ladene Artist, MD as Consulting Physician (Gastroenterology) ? ?Indicate any recent Medical Services you may have received from other than [facility] providers in the past year (date may be approximate). ? ?   ?Assessment:  ? This is a routine wellness examination for Brian Branch. ? ?Hearing/Vision screen ?Vision Screening - Comments:: Patient  declines eye exam  ? ?Dietary issues and exercise activities discussed: ?Current Exercise Habits: The patient does not participate in regular exercise at present, Exercise limited by: None identified ? ? Goals Addre

## 2021-10-30 NOTE — Patient Instructions (Signed)
Brian Branch , ?Thank you for taking time to come for your Medicare Wellness Visit. I appreciate your ongoing commitment to your health goals. Please review the following plan we discussed and let me know if I can assist you in the future.  ? ?Screening recommendations/referrals: ?Colonoscopy: no longer required  ?Recommended yearly ophthalmology/optometry visit for glaucoma screening and checkup ?Recommended yearly dental visit for hygiene and checkup ? ?Vaccinations: ?Influenza vaccine: completed  ?Pneumococcal vaccine: completed  ?Tdap vaccine: 05/06/2020 ?Shingles vaccine: declined    ? ?Advanced directives: none ? ?Conditions/risks identified: none  ? ?Next appointment: none  ? ?Preventive Care 45 Years and Older, Male ?Preventive care refers to lifestyle choices and visits with your health care provider that can promote health and wellness. ?What does preventive care include? ?A yearly physical exam. This is also called an annual well check. ?Dental exams once or twice a year. ?Routine eye exams. Ask your health care provider how often you should have your eyes checked. ?Personal lifestyle choices, including: ?Daily care of your teeth and gums. ?Regular physical activity. ?Eating a healthy diet. ?Avoiding tobacco and drug use. ?Limiting alcohol use. ?Practicing safe sex. ?Taking low doses of aspirin every day. ?Taking vitamin and mineral supplements as recommended by your health care provider. ?What happens during an annual well check? ?The services and screenings done by your health care provider during your annual well check will depend on your age, overall health, lifestyle risk factors, and family history of disease. ?Counseling  ?Your health care provider may ask you questions about your: ?Alcohol use. ?Tobacco use. ?Drug use. ?Emotional well-being. ?Home and relationship well-being. ?Sexual activity. ?Eating habits. ?History of falls. ?Memory and ability to understand (cognition). ?Work and work  Statistician. ?Screening  ?You may have the following tests or measurements: ?Height, weight, and BMI. ?Blood pressure. ?Lipid and cholesterol levels. These may be checked every 5 years, or more frequently if you are over 39 years old. ?Skin check. ?Lung cancer screening. You may have this screening every year starting at age 36 if you have a 30-pack-year history of smoking and currently smoke or have quit within the past 15 years. ?Fecal occult blood test (FOBT) of the stool. You may have this test every year starting at age 28. ?Flexible sigmoidoscopy or colonoscopy. You may have a sigmoidoscopy every 5 years or a colonoscopy every 10 years starting at age 81. ?Prostate cancer screening. Recommendations will vary depending on your family history and other risks. ?Hepatitis C blood test. ?Hepatitis B blood test. ?Sexually transmitted disease (STD) testing. ?Diabetes screening. This is done by checking your blood sugar (glucose) after you have not eaten for a while (fasting). You may have this done every 1-3 years. ?Abdominal aortic aneurysm (AAA) screening. You may need this if you are a current or former smoker. ?Osteoporosis. You may be screened starting at age 41 if you are at high risk. ?Talk with your health care provider about your test results, treatment options, and if necessary, the need for more tests. ?Vaccines  ?Your health care provider may recommend certain vaccines, such as: ?Influenza vaccine. This is recommended every year. ?Tetanus, diphtheria, and acellular pertussis (Tdap, Td) vaccine. You may need a Td booster every 10 years. ?Zoster vaccine. You may need this after age 59. ?Pneumococcal 13-valent conjugate (PCV13) vaccine. One dose is recommended after age 41. ?Pneumococcal polysaccharide (PPSV23) vaccine. One dose is recommended after age 58. ?Talk to your health care provider about which screenings and vaccines you need and how often  you need them. ?This information is not intended to replace  advice given to you by your health care provider. Make sure you discuss any questions you have with your health care provider. ?Document Released: 08/05/2015 Document Revised: 03/28/2016 Document Reviewed: 05/10/2015 ?Elsevier Interactive Patient Education ? 2017 [place]. ? ?Fall Prevention in the Home ?Falls can cause injuries. They can happen to people of all ages. There are many things you can do to make your home safe and to help prevent falls. ?What can I do on the outside of my home? ?Regularly fix the edges of walkways and driveways and fix any cracks. ?Remove anything that might make you trip as you walk through a door, such as a raised step or threshold. ?Trim any bushes or trees on the path to your home. ?Use bright outdoor lighting. ?Clear any walking paths of anything that might make someone trip, such as rocks or tools. ?Regularly check to see if handrails are loose or broken. Make sure that both sides of any steps have handrails. ?Any raised decks and porches should have guardrails on the edges. ?Have any leaves, snow, or ice cleared regularly. ?Use sand or salt on walking paths during winter. ?Clean up any spills in your garage right away. This includes oil or grease spills. ?What can I do in the bathroom? ?Use night lights. ?Install grab bars by the toilet and in the tub and shower. Do not use towel bars as grab bars. ?Use non-skid mats or decals in the tub or shower. ?If you need to sit down in the shower, use a plastic, non-slip stool. ?Keep the floor dry. Clean up any water that spills on the floor as soon as it happens. ?Remove soap buildup in the tub or shower regularly. ?Attach bath mats securely with double-sided non-slip rug tape. ?Do not have throw rugs and other things on the floor that can make you trip. ?What can I do in the bedroom? ?Use night lights. ?Make sure that you have a light by your bed that is easy to reach. ?Do not use any sheets or blankets that are too big for your bed.  They should not hang down onto the floor. ?Have a firm chair that has side arms. You can use this for support while you get dressed. ?Do not have throw rugs and other things on the floor that can make you trip. ?What can I do in the kitchen? ?Clean up any spills right away. ?Avoid walking on wet floors. ?Keep items that you use a lot in easy-to-reach places. ?If you need to reach something above you, use a strong step stool that has a grab bar. ?Keep electrical cords out of the way. ?Do not use floor polish or wax that makes floors slippery. If you must use wax, use non-skid floor wax. ?Do not have throw rugs and other things on the floor that can make you trip. ?What can I do with my stairs? ?Do not leave any items on the stairs. ?Make sure that there are handrails on both sides of the stairs and use them. Fix handrails that are broken or loose. Make sure that handrails are as long as the stairways. ?Check any carpeting to make sure that it is firmly attached to the stairs. Fix any carpet that is loose or worn. ?Avoid having throw rugs at the top or bottom of the stairs. If you do have throw rugs, attach them to the floor with carpet tape. ?Make sure that you have a light  switch at the top of the stairs and the bottom of the stairs. If you do not have them, ask someone to add them for you. ?What else can I do to help prevent falls? ?Wear shoes that: ?Do not have high heels. ?Have rubber bottoms. ?Are comfortable and fit you well. ?Are closed at the toe. Do not wear sandals. ?If you use a stepladder: ?Make sure that it is fully opened. Do not climb a closed stepladder. ?Make sure that both sides of the stepladder are locked into place. ?Ask someone to hold it for you, if possible. ?Clearly mark and make sure that you can see: ?Any grab bars or handrails. ?First and last steps. ?Where the edge of each step is. ?Use tools that help you move around (mobility aids) if they are needed. These  include: ?Canes. ?Walkers. ?Scooters. ?Crutches. ?Turn on the lights when you go into a dark area. Replace any light bulbs as soon as they burn out. ?Set up your furniture so you have a clear path. Avoid moving your furniture around. ?I

## 2021-11-22 ENCOUNTER — Ambulatory Visit: Payer: Medicare Other | Attending: Family Medicine | Admitting: Physical Therapy

## 2021-11-22 ENCOUNTER — Encounter: Payer: Self-pay | Admitting: Physical Therapy

## 2021-11-22 DIAGNOSIS — R293 Abnormal posture: Secondary | ICD-10-CM | POA: Insufficient documentation

## 2021-11-22 DIAGNOSIS — M6281 Muscle weakness (generalized): Secondary | ICD-10-CM | POA: Insufficient documentation

## 2021-11-22 DIAGNOSIS — M25512 Pain in left shoulder: Secondary | ICD-10-CM | POA: Insufficient documentation

## 2021-11-22 DIAGNOSIS — M25551 Pain in right hip: Secondary | ICD-10-CM | POA: Diagnosis present

## 2021-11-22 DIAGNOSIS — G8929 Other chronic pain: Secondary | ICD-10-CM | POA: Diagnosis present

## 2021-11-22 NOTE — Therapy (Signed)
[facility] ?Portal ?[address] ?[place], [place], [address] ?Phone: [phone]   Fax:  [phone] ? ?Physical Therapy Evaluation ? ?Patient Details  ?Name: Brian Branch ?MRN: [id number] ?Date of Birth: February 28, 1941 ?Referring Provider (PT): Hulan Saas ? ? ?Encounter Date: 11/22/2021 ? ? PT End of Session - 11/22/21 0940   ? ? Visit Number 1   ? Number of Visits 13   ? Date for PT Re-Evaluation 01/03/22   ? Authorization Type [employer]   ? Authorization Time Period 11/22/21 to 01/17/22   ? PT Start Time [phone]   ? PT Stop Time 0109   ? PT Time Calculation (min) 40 min   ? Activity Tolerance Patient tolerated treatment well   ? Behavior During Therapy [facility] for tasks assessed/performed   ? ?  ?  ? ?  ? ? ?Past Medical History:  ?Diagnosis Date  ? ALLERGIC RHINITIS 02/15/2007  ? Allergy   ? BENIGN PROSTATIC HYPERTROPHY 02/15/2007  ? CAD (coronary artery disease)   ? COLONIC POLYPS, HX OF 08/29/2007  ? CORONARY ARTERY DISEASE 02/15/2007  ? 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 DEPRESSION 08/29/2007  ? pt denies having depression  ? ED (erectile dysfunction)   ? FATIGUE 08/29/2007  ? GERD (gastroesophageal reflux disease)   ? Heart attack ([place])   ? [place]  ? HYPERLIPIDEMIA 02/15/2007  ? INSOMNIA-SLEEP DISORDER-UNSPEC 03/09/2010  ? KNEE PAIN, LEFT 08/29/2007  ? 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 SHINGLES, 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  ? TOBACCO ABUSE 03/09/2010  ? Unspecified eustachian tube disorder 03/09/2010  ? ? ?Past Surgical History:  ?Procedure Laterality Date  ? APPENDECTOMY  1951  ? CARDIAC CATHETERIZATION    ? [place]  ? COLONOSCOPY    ? CORONARY ARTERY BYPASS GRAFT  1991  ? MICROLARYNGOSCOPY N/A 06/01/2020  ? Procedure: SUSPENDED MICRO DIRECT LARYNGOSCOPY WITH PROLARYN INJECTIONS & JET VENTILATION;  Surgeon: Melida Quitter, MD;  Location: [facility];  Service: ENT;  Laterality: N/A;  ? POLYPECTOMY    ? TONSILLECTOMY AND ADENOIDECTOMY    ? VASECTOMY  1978   ? ? ?There were no vitals filed for this visit. ? ? ? Subjective Assessment - 11/22/21 0849   ? ? Subjective In 2014 I saw Dr. Tamala Julian about my hip pain and he told me it was OA; he said we could do shots and I didn't want to do that, its gotten progressively worse since then. A couple of weeks ago I saw Dr. Tamala Julian again and he found a bone spur in the hip joint. For my shoulder, he said it was likely bursiitis. Its more a general pain in my shoulder, at times its at the joint but most times the pain is a few inches below the joint depending on what I'm doing. That's where it is today.   ? Patient Stated Goals see if there's anything you can recommend that can help things or not   ? Currently in Pain? No/denies   hip can be up to 6/10 when standing and walking after sitting for awhile, shoulder can be up to 7/10 at worst; hip pain calms down after walking for a few feet, shoulder calms down after a few minutes too typically  ? ?  ?  ? ?  ? ? ? ? ? [facility] PT Assessment - 11/22/21 0001   ? ?  ? Assessment  ? Medical Diagnosis R hip and L shoulder pain   ?  Referring Provider (PT) Hulan Saas   ? Onset Date/Surgical Date --   chronic  ? Next MD Visit Dr. Tamala Julian in 6 months   ? Prior Therapy PT in 1999-2000 for neck pain   ?  ? Precautions  ? Precautions None   ?  ? Restrictions  ? Weight Bearing Restrictions No   ?  ? Balance Screen  ? Has the patient fallen in the past 6 months No   ? Has the patient had a decrease in activity level because of a fear of falling?  No   ? Is the patient reluctant to leave their home because of a fear of falling?  No   ?  ? Home Environment  ? Living Environment Private residence   ?  ? Prior Function  ? Level of Independence Independent;Independent with basic ADLs;Independent with gait;Independent with transfers   ? Vocation Retired   ? Leisure reading and watching TV, sitting outside   ?  ? Observation/Other Assessments  ? Observations R hip severe limitation with ER, deep joint pain   ?   ? ROM / Strength  ? AROM / PROM / Strength AROM;Strength   ?  ? AROM  ? AROM Assessment Site Shoulder   ? Right/Left Shoulder Left;Right   ? Right Shoulder Flexion --   WNL  ? Right Shoulder ABduction --   WNL  ? Right Shoulder Internal Rotation --   FIR T8  ? Right Shoulder External Rotation --   FER C7  ? Left Shoulder Flexion --   WNL  ? Left Shoulder ABduction --   WNL  ? Left Shoulder Internal Rotation --   FER T8  ? Left Shoulder External Rotation --   FER C7  ?  ? Strength  ? Strength Assessment Site Shoulder;Hip;Knee;Ankle   ? Right/Left Shoulder Right;Left   ? Right Shoulder Flexion 4/5   ? Right Shoulder Extension 4-/5   ? Right Shoulder ABduction 4/5   ? Right Shoulder Internal Rotation 4-/5   ? Right Shoulder External Rotation 4-/5   ? Left Shoulder Flexion 4/5   ? Left Shoulder Extension 4-/5   ? Left Shoulder ABduction 4/5   ? Left Shoulder Internal Rotation 4-/5   ? Left Shoulder External Rotation 4-/5   ? Right/Left Hip Right;Left   ? Right Hip Flexion 3+/5   ? Right Hip Extension 3/5   ? Right Hip ABduction 3+/5   ? Left Hip Flexion 4+/5   ? Left Hip Extension 3/5   ? Left Hip ABduction 4+/5   ? Right/Left Knee Left;Right   ? Right Knee Flexion 4-/5   ? Right Knee Extension 4/5   ? Left Knee Flexion 4/5   ? Left Knee Extension 4+/5   ? Right/Left Ankle Right;Left   ? Right Ankle Dorsiflexion 5/5   ? Left Ankle Dorsiflexion 5/5   ?  ? Palpation  ? Palpation comment unable to find any areas TTP L shoulder   ? ?  ?  ? ?  ? ? ? ? ? ? ? ? ? ? ? ? ? ?Objective measurements completed on examination: See above findings.  ? ? ? ? ? [facility] Adult PT Treatment/Exercise - 11/22/21 0001   ? ?  ? Exercises  ? Exercises Knee/Hip;Shoulder   ?  ? Knee/Hip Exercises: Supine  ? Bridges Both;1 set;10 reps   ? Bridges Limitations 2 second holds   ?  ? Knee/Hip Exercises: Sidelying  ? Hip ABduction Both;1  set;10 reps   ?  ? Shoulder Exercises: Seated  ? Flexion Strengthening;10 reps;Both   ? Theraband Level (Shoulder  Flexion) Level 2 (Red)   ? Abduction Strengthening;Both;10 reps   ? Theraband Level (Shoulder ABduction) Level 2 (Red)   ? ?  ?  ? ?  ? ? ? ? ? ? ? ? ? ? PT Education - 11/22/21 0939   ? ? Education Details exam findings, POC, HEP   ? Person(s) Educated Patient   ? Methods Explanation;Demonstration;Handout   ? Comprehension Verbalized understanding;Returned demonstration   ? ?  ?  ? ?  ? ? ? PT Short Term Goals - 11/22/21 0944   ? ?  ? PT SHORT TERM GOAL #1  ? Title Will be compliant with appropriate progressive HEP   ? Time 3   ? Period Weeks   ? Status New   ? Target Date 12/13/21   ?  ? PT SHORT TERM GOAL #2  ? Title Pain in R hip and L shoulder will be no more than 4/10 at worst with functional task performance   ? Time 3   ? Period Weeks   ? Status New   ?  ? PT SHORT TERM GOAL #3  ? Title Will not have any pain with initial sit to stand to show improvement in hip pain and function   ? Time 3   ? Period Weeks   ? Status New   ? ?  ?  ? ?  ? ? ? ? PT Long Term Goals - 11/22/21 0949   ? ?  ? PT LONG TERM GOAL #1  ? Title MMT to improve by at least 1 grade in all weak groups   ? Time 6   ? Period Weeks   ? Status New   ? Target Date 01/03/22   ?  ? PT LONG TERM GOAL #2  ? Title Pain in R hip and L shoulder to be no more than 2/10 with functional task performance   ? Time 6   ? Period Weeks   ? Status New   ?  ? PT LONG TERM GOAL #3  ? Title Will demonstrate good understanding of functional biomechanics to help prevent recurrence of hip and shoulder pain   ? Time 6   ? Period Weeks   ? Status New   ?  ? PT LONG TERM GOAL #4  ? Title Will be compliant with appropriate gym based exercise program to maintain functional gains from PT and prevent recurrence of pain   ? Time 6   ? Period Weeks   ? Status New   ? ?  ?  ? ?  ? ? ? ? ? ? ? ? ? Plan - 11/22/21 0941   ? ? Clinical Impression Statement Kelby arrives today with chronic R hip and L shoulder pain. He would just like to find out if we can do anything to help his  pain at this point, really. Exam is very straight forward with primary limitations being functional weakness, postural impairments, and generalized muscle stiffness. Will benefit from skilled PT services to addres

## 2021-11-28 ENCOUNTER — Ambulatory Visit: Payer: Medicare Other | Admitting: Physical Therapy

## 2021-11-30 ENCOUNTER — Ambulatory Visit: Payer: Medicare Other | Admitting: Physical Therapy

## 2021-12-04 ENCOUNTER — Ambulatory Visit: Payer: Medicare Other | Admitting: Physical Therapy

## 2021-12-06 ENCOUNTER — Ambulatory Visit: Payer: Medicare Other | Admitting: Physical Therapy

## 2021-12-11 NOTE — Progress Notes (Unsigned)
[facility] [place] Phone: [phone] Subjective:   Fontaine No, am serving as a scribe for Dr. Hulan Saas.  This visit occurred during the SARS-CoV-2 public health emergency.  Safety protocols were in place, including screening questions prior to the visit, additional usage of staff PPE, and extensive cleaning of exam room while observing appropriate contact time as indicated for disinfecting solutions.    I'm seeing this patient by the request  of:  Biagio Borg, MD  CC: right hip pain   [id number]  10/25/2021 Patient did have x-rays and does show worsening hip arthritis at this moment.  Overread is pending.  Discussed with patient about icing regimen, home exercises, formal physical therapy which I think will be beneficial.  Worsening pain can consider intra-articular injection.  Patient does have a very large bone spur noted on the anterior lateral aspect of the hip that is likely contributing to some impingement.  Due to the severity of the R hip arthritis I think any surgeon would be considering a hip replacement.  Patient wants to avoid that at this moment.  Worsening pain we can discuss medications at follow-up in 6 to 8 weeks  Updated 12/12/2021 MELFORD TULLIER is a 81 y.o. male coming in with complaint of right hip and L shoulder pain. Patient has good and bad moments throughout the day in R hip. Sitting makes his pain worse. Pain is lateral aspect of upper leg.   Patient went to one session and then did 2 days work at home for L shoulder. No change in pain and exercises make his shoulder hurt worse. Pain in middle deltoid.     Xray IMPRESSION: 1. No acute fracture or malalignment of the lumbar spine. 2. Mild degenerative changes of the lumbar spine have progressed. 3. There is new osseous tapering of the distal sacrum on the lateral view. Findings may be related to prior trauma or infection in  the appropriate clinical setting. Please correlate clinically.  IMPRESSION: 1. No evidence of fractures. 2. Degenerative and enthesopathic change. 3. Aortic and femoral artery atherosclerosis.       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   DEPRESSION 08/29/2007   pt denies having depression   ED (erectile dysfunction)    FATIGUE 08/29/2007   GERD (gastroesophageal reflux disease)    Heart attack ([place])    [place] 02/15/2007   INSOMNIA-SLEEP DISORDER-UNSPEC 03/09/2010   KNEE PAIN, LEFT 08/29/2007   MYOCARDIAL INFARCTION, 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   SHINGLES, 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   TOBACCO ABUSE 03/09/2010   Unspecified eustachian tube disorder 03/09/2010   Past Surgical History:  Procedure Laterality Date   [place]   COLONOSCOPY     CORONARY ARTERY BYPASS GRAFT  1991   MICROLARYNGOSCOPY N/A 06/01/2020   Procedure: SUSPENDED MICRO DIRECT LARYNGOSCOPY WITH PROLARYN INJECTIONS & JET VENTILATION;  Surgeon: Melida Quitter, MD;  Location: [facility];  Service: ENT;  Laterality: N/A;   POLYPECTOMY     TONSILLECTOMY AND ADENOIDECTOMY     VASECTOMY  1978   Social History   Socioeconomic History   Marital status:  Married    Spouse name: Not on file   Number of children: 1   Years of education: Not on file   Highest education level: Not on file  Occupational History   Not on file  Tobacco Use   Smoking status: Former    Types: Cigarettes    Quit date: 07/23/2008    Years since quitting: 13.4   Smokeless tobacco: Never  Vaping Use   Vaping Use: Never used  Substance and Sexual Activity   Alcohol use: Yes    Alcohol/week: 2.0 standard drinks    Types: 2 Cans of beer per week     Comment: occassionally I will drink about 2 beers   Drug use: No   Sexual activity: Not Currently  Other Topics Concern   Not on file  Social History Narrative   Not on file   Social Determinants of Health   Financial Resource Strain: Low Risk    Difficulty of Paying Living Expenses: Not hard at all  Food Insecurity: No Food Insecurity   Worried About Charity fundraiser in the Last Year: Never true   Ran Out of Food in the Last Year: Never true  Transportation Needs: No Transportation Needs   Lack of Transportation (Medical): No   Lack of Transportation (Non-Medical): No  Physical Activity: Inactive   Days of Exercise per Week: 0 days   Minutes of Exercise per Session: 0 min  Stress: No Stress Concern Present   Feeling of Stress : Not at all  Social Connections: Moderately Isolated   Frequency of Communication with Friends and Family: Twice a week   Frequency of Social Gatherings with Friends and Family: Twice a week   Attends Religious Services: Never   Marine scientist or Organizations: No   Attends Music therapist: Never   Marital Status: Married   Allergies  Allergen Reactions   Levitra [Vardenafil Hydrochloride]     headache   Lincomycin Hives   Penicillins Hives    REACTION: 40 years ago   Tadalafil     REACTION: headache   Triazolam     REACTION: hallucinations   Vardenafil Nausea And Vomiting    headache   Family History  Problem Relation Age of Onset   Arthritis Mother        Rhuematoid   Heart disease Father    Colon cancer Neg Hx    Esophageal cancer Neg Hx    Rectal cancer Neg Hx    Stomach cancer Neg Hx    Pancreatic cancer Neg Hx    Prostate cancer Neg Hx    Colon polyps Neg Hx      Current Outpatient Medications (Cardiovascular):    rosuvastatin (CRESTOR) 20 MG tablet, Take 1 tablet (20 mg total) by mouth at bedtime.  Current Outpatient Medications (Respiratory):    diphenhydrAMINE (BENADRYL) 25 mg capsule, Take 25 mg  by mouth in the morning and at bedtime. Allergies   fluticasone (FLONASE) 50 MCG/ACT nasal spray, Place 2 sprays into both nostrils in the morning and at bedtime.  Current Outpatient Medications (Analgesics):    acetaminophen (TYLENOL) 500 MG tablet, Take 1,000 mg by mouth every 6 (six) hours as needed for mild pain or moderate pain. Rapid release   aspirin (ASPIRIN LOW DOSE) 81 MG EC tablet, TAKE 1 TABLET DAILY (SWALLOW WHOLE)  Current Outpatient Medications (Hematological):    Cyanocobalamin (VITAMIN B-12) 5000 MCG SUBL, Place 5,000 mcg under the tongue daily.  Current Outpatient Medications (  Other):    Cholecalciferol 50 MCG (2000 UT) TABS, 1 tab by mouth once daily   omeprazole (PRILOSEC) 40 MG capsule, Take 1 capsule (40 mg total) by mouth in the morning and at bedtime.   solifenacin (VESICARE) 5 MG tablet, Take 1 tablet (5 mg total) by mouth daily.   tamsulosin (FLOMAX) 0.4 MG CAPS capsule, Take 1 capsule (0.4 mg total) by mouth daily.   Reviewed prior external information including notes and imaging from  primary care provider As well as notes that were available from care everywhere and other healthcare systems.  Past medical history, social, surgical and family history all reviewed in electronic medical record.  No pertanent information unless stated regarding to the chief complaint.   Review of Systems:  No headache, visual changes, nausea, vomiting, diarrhea, constipation, dizziness, abdominal pain, skin rash, fevers, chills, night sweats, weight loss, swollen lymph nodes, body aches, joint swelling, chest pain, shortness of breath, mood changes. POSITIVE muscle aches  Objective  Blood pressure 114/66, pulse 70, height '5\' 5"'$  (1.651 m), weight 152 lb (68.9 kg), SpO2 97 %.   General: No apparent distress alert and oriented x3 mood and affect normal, dressed appropriately.  HEENT: Pupils equal, extraocular movements intact  Respiratory: Patient's speak in full sentences and  does not appear short of breath  Cardiovascular: No lower extremity edema, non tender, no erythema  Gait antalgic MSK: Right hip exam shows tenderness to palpation in the paraspinal musculature.  Patient does have more pain over the lateral aspect of the hip.  Patient still has some limited internal range of motion noted.  Left shoulder exam shows the patient does have weakness with 3 out of 5 strength of the rotator cuff.  Limited range of motion with pain with audible crepitus noted.  Patient has limited external rotation of only 10 degrees.  After verbal consent patient was prepped with alcohol swab and with a 21-gauge 2 inch needle injected into the right greater trochanteric area with 2 cc of 0.5% Marcaine and 1 cc of Kenalog 40 mg/mL.  No blood loss.  Band-Aid placed.  Postinjection instructions given    Impression and Recommendations:     The above documentation has been reviewed and is accurate and complete Lyndal Pulley, DO

## 2021-12-12 ENCOUNTER — Ambulatory Visit: Payer: Medicare Other | Admitting: Physical Therapy

## 2021-12-12 ENCOUNTER — Ambulatory Visit: Payer: Self-pay

## 2021-12-12 ENCOUNTER — Encounter: Payer: Self-pay | Admitting: Family Medicine

## 2021-12-12 ENCOUNTER — Ambulatory Visit: Payer: Medicare Other | Admitting: Family Medicine

## 2021-12-12 VITALS — BP 114/66 | HR 70 | Ht 65.0 in | Wt 152.0 lb

## 2021-12-12 DIAGNOSIS — G8929 Other chronic pain: Secondary | ICD-10-CM

## 2021-12-12 DIAGNOSIS — M7061 Trochanteric bursitis, right hip: Secondary | ICD-10-CM | POA: Diagnosis not present

## 2021-12-12 DIAGNOSIS — M25551 Pain in right hip: Secondary | ICD-10-CM | POA: Diagnosis not present

## 2021-12-12 DIAGNOSIS — M25512 Pain in left shoulder: Secondary | ICD-10-CM

## 2021-12-12 NOTE — Patient Instructions (Addendum)
Injected hip today MRI L shoulder [phone] We will be in touch with results

## 2021-12-13 ENCOUNTER — Ambulatory Visit: Payer: Medicare Other | Admitting: Family Medicine

## 2021-12-13 DIAGNOSIS — M25512 Pain in left shoulder: Secondary | ICD-10-CM | POA: Insufficient documentation

## 2021-12-13 DIAGNOSIS — M7061 Trochanteric bursitis, right hip: Secondary | ICD-10-CM | POA: Insufficient documentation

## 2021-12-13 NOTE — Assessment & Plan Note (Signed)
Injection given today and tolerated the procedure well, discussed icing regimen and home exercise, discussed which activities to do and which ones to avoid.  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  Follow-up again in 6 to 8 weeks

## 2021-12-13 NOTE — Assessment & Plan Note (Signed)
More of an acute onset of the left shoulder pain.  Seems to be worsening at this time.  Discussed with patient icing regimen.  Patient is having difficulty with daily activities as well as sleep.  Patient does not feel like it is improving with the conservative therapy.  Tried including home exercises, medications, and icing regimen.  We will get MRI to further evaluate the rotator cuff to give patient a possibility for surgical intervention if necessary.  Otherwise we will consider injections Finally this and more of a palliative treatment for this condition.  Follow-up after MRI

## 2021-12-15 ENCOUNTER — Ambulatory Visit: Payer: Medicare Other | Admitting: Physical Therapy

## 2021-12-19 ENCOUNTER — Ambulatory Visit: Payer: Medicare Other | Admitting: Physical Therapy

## 2021-12-19 ENCOUNTER — Ambulatory Visit
Admission: RE | Admit: 2021-12-19 | Discharge: 2021-12-19 | Disposition: A | Discharge status: Home / Self Care | Payer: Medicare Other | Source: Ambulatory Visit | Attending: Family Medicine | Admitting: Family Medicine

## 2021-12-19 DIAGNOSIS — M25512 Pain in left shoulder: Secondary | ICD-10-CM

## 2021-12-20 ENCOUNTER — Encounter: Payer: Self-pay | Admitting: Family Medicine

## 2021-12-21 ENCOUNTER — Other Ambulatory Visit: Payer: Self-pay

## 2021-12-21 ENCOUNTER — Ambulatory Visit: Payer: Medicare Other | Admitting: Physical Therapy

## 2021-12-21 DIAGNOSIS — M25512 Pain in left shoulder: Secondary | ICD-10-CM

## 2021-12-25 ENCOUNTER — Ambulatory Visit: Payer: Medicare Other | Admitting: Physical Therapy

## 2021-12-27 ENCOUNTER — Ambulatory Visit: Payer: Medicare Other | Admitting: Physical Therapy

## 2022-01-01 ENCOUNTER — Ambulatory Visit: Payer: Medicare Other | Admitting: Physical Therapy

## 2022-01-03 ENCOUNTER — Ambulatory Visit: Payer: Medicare Other | Admitting: Physical Therapy

## 2022-05-10 ENCOUNTER — Ambulatory Visit (INDEPENDENT_AMBULATORY_CARE_PROVIDER_SITE_OTHER): Payer: Medicare Other | Admitting: Internal Medicine

## 2022-05-10 VITALS — BP 126/72 | HR 63 | Temp 98.5°F | Ht 65.0 in | Wt 150.0 lb

## 2022-05-10 DIAGNOSIS — M25512 Pain in left shoulder: Secondary | ICD-10-CM

## 2022-05-10 DIAGNOSIS — Z0001 Encounter for general adult medical examination with abnormal findings: Secondary | ICD-10-CM

## 2022-05-10 DIAGNOSIS — E785 Hyperlipidemia, unspecified: Secondary | ICD-10-CM | POA: Diagnosis not present

## 2022-05-10 DIAGNOSIS — E559 Vitamin D deficiency, unspecified: Secondary | ICD-10-CM | POA: Diagnosis not present

## 2022-05-10 DIAGNOSIS — R739 Hyperglycemia, unspecified: Secondary | ICD-10-CM

## 2022-05-10 DIAGNOSIS — E538 Deficiency of other specified B group vitamins: Secondary | ICD-10-CM

## 2022-05-10 DIAGNOSIS — Z0000 Encounter for general adult medical examination without abnormal findings: Secondary | ICD-10-CM | POA: Diagnosis not present

## 2022-05-10 LAB — URINALYSIS, ROUTINE W REFLEX MICROSCOPIC
Bilirubin Urine: NEGATIVE
Hgb urine dipstick: NEGATIVE
Ketones, ur: NEGATIVE
Leukocytes,Ua: NEGATIVE
Nitrite: NEGATIVE
RBC / HPF: NONE SEEN (ref 0–?)
Specific Gravity, Urine: 1.015 (ref 1.000–1.030)
Total Protein, Urine: NEGATIVE
Urine Glucose: NEGATIVE
Urobilinogen, UA: 1 (ref 0.0–1.0)
pH: 6 (ref 5.0–8.0)

## 2022-05-10 LAB — LIPID PANEL
Cholesterol: 103 mg/dL (ref 0–200)
HDL: 36.4 mg/dL — ABNORMAL LOW (ref >39.00)
LDL Cholesterol: 45 mg/dL (ref 0–99)
NonHDL: 67.02
Total CHOL/HDL Ratio: 3
Triglycerides: 111 mg/dL (ref 0.0–149.0)
VLDL: 22.2 mg/dL (ref 0.0–40.0)

## 2022-05-10 LAB — VITAMIN D 25 HYDROXY (VIT D DEFICIENCY, FRACTURES): VITD: 97.67 ng/mL (ref 30.00–100.00)

## 2022-05-10 LAB — CBC WITH DIFFERENTIAL/PLATELET
Basophils Absolute: 0 10*3/uL (ref 0.0–0.1)
Basophils Relative: 0.5 % (ref 0.0–3.0)
Eosinophils Absolute: 0.3 10*3/uL (ref 0.0–0.7)
Eosinophils Relative: 3.8 % (ref 0.0–5.0)
HCT: 42.4 % (ref 39.0–52.0)
Hemoglobin: 14.2 g/dL (ref 13.0–17.0)
Lymphocytes Relative: 13.8 % (ref 12.0–46.0)
Lymphs Abs: 1.2 10*3/uL (ref 0.7–4.0)
MCHC: 33.4 g/dL (ref 30.0–36.0)
MCV: 94.7 fl (ref 78.0–100.0)
Monocytes Absolute: 0.6 10*3/uL (ref 0.1–1.0)
Monocytes Relative: 7.3 % (ref 3.0–12.0)
Neutro Abs: 6.4 10*3/uL (ref 1.4–7.7)
Neutrophils Relative %: 74.6 % (ref 43.0–77.0)
Platelets: 204 10*3/uL (ref 150.0–400.0)
RBC: 4.48 Mil/uL (ref 4.22–5.81)
RDW: 13 % (ref 11.5–15.5)
WBC: 8.6 10*3/uL (ref 4.0–10.5)

## 2022-05-10 LAB — HEPATIC FUNCTION PANEL
ALT: 16 U/L (ref 0–53)
AST: 23 U/L (ref 0–37)
Albumin: 4.4 g/dL (ref 3.5–5.2)
Alkaline Phosphatase: 41 U/L (ref 39–117)
Bilirubin, Direct: 0.2 mg/dL (ref 0.0–0.3)
Total Bilirubin: 0.6 mg/dL (ref 0.2–1.2)
Total Protein: 6.8 g/dL (ref 6.0–8.3)

## 2022-05-10 LAB — BASIC METABOLIC PANEL
BUN: 17 mg/dL (ref 6–23)
CO2: 29 mEq/L (ref 19–32)
Calcium: 9.3 mg/dL (ref 8.4–10.5)
Chloride: 102 mEq/L (ref 96–112)
Creatinine, Ser: 1.09 mg/dL (ref 0.40–1.50)
GFR: 64.21 mL/min (ref >60.00)
Glucose, Bld: 100 mg/dL — ABNORMAL HIGH (ref 70–99)
Potassium: 4.4 mEq/L (ref 3.5–5.1)
Sodium: 138 mEq/L (ref 135–145)

## 2022-05-10 LAB — TSH: TSH: 1.28 u[IU]/mL (ref 0.35–5.50)

## 2022-05-10 LAB — MICROALBUMIN / CREATININE URINE RATIO
Creatinine,U: 107.8 mg/dL
Microalb Creat Ratio: 0.9 mg/g (ref 0.0–30.0)
Microalb, Ur: 1 mg/dL (ref 0.0–1.9)

## 2022-05-10 LAB — VITAMIN B12: Vitamin B-12: >1500 pg/mL — ABNORMAL HIGH (ref 211–911)

## 2022-05-10 LAB — HEMOGLOBIN A1C: Hgb A1c MFr Bld: 6.2 % (ref 4.6–6.5)

## 2022-05-10 MED ORDER — TAMSULOSIN HCL 0.4 MG PO CAPS: 0.4000 mg | ORAL_CAPSULE | Freq: Every day | ORAL | 3 refills | Status: DC

## 2022-05-10 MED ORDER — OMEPRAZOLE 40 MG PO CPDR: 40.0000 mg | DELAYED_RELEASE_CAPSULE | Freq: Two times a day (BID) | ORAL | 3 refills | Status: DC

## 2022-05-10 MED ORDER — SOLIFENACIN SUCCINATE 5 MG PO TABS: 5.0000 mg | ORAL_TABLET | Freq: Every day | ORAL | 3 refills | Status: DC

## 2022-05-10 MED ORDER — FLUTICASONE PROPIONATE 50 MCG/ACT NA SUSP: 2.0000 | Freq: Two times a day (BID) | NASAL | 3 refills | Status: DC

## 2022-05-10 MED ORDER — ASPIRIN 81 MG PO TBEC: DELAYED_RELEASE_TABLET | ORAL | 3 refills | Status: DC

## 2022-05-10 MED ORDER — ROSUVASTATIN CALCIUM 20 MG PO TABS: 20.0000 mg | ORAL_TABLET | Freq: Every day | ORAL | 3 refills | Status: DC

## 2022-05-10 NOTE — Progress Notes (Signed)
Patient ID: Brian Branch, male   DOB: [birth year], 81 y.o.   MRN: [id number]         Chief Complaint:: wellness exam and low vit d, hld, hyperglyemia       HPI:  Brian Branch is a 81 y.o. male here for wellness exam; declines colonoscopy and shignrix for now, o/w up to date               Also saw sport med after fall out of tub with cortisone injenction, later found to have left rotater cuff tear, saw ortho who recommended arthroscopy but he has deferred for now, asking for possible ultram ER.  Pt denies chest pain, increased sob or doe, wheezing, orthopnea, PND, increased LE swelling, palpitations, dizziness or syncope.   Pt denies polydipsia, polyuria, or new focal neuro s/s.    Pt denies fever, wt loss, night sweats, loss of appetite, or other constitutional symptoms     Wt Readings from Last 3 Encounters:  05/10/22 150 lb (68 kg)  12/12/21 152 lb (68.9 kg)  05/09/21 159 lb 12.8 oz (72.5 kg)   BP Readings from Last 3 Encounters:  05/10/22 126/72  12/12/21 114/66  10/25/21 110/62   Immunization History  Administered Date(s) Administered   Fluad Quad(high Dose 65+) 05/06/2019, 05/06/2020   Influenza Split 04/13/2011, 04/09/2012   Influenza Whole 04/12/2010   Influenza, High Dose Seasonal PF 04/26/2017, 04/30/2018   Influenza,inj,Quad PF,6+ Mos 04/10/2013, 04/16/2014, 04/22/2015, 04/25/2016   Influenza-Unspecified 05/01/2021, 04/26/2022   PFIZER(Purple Top)SARS-COV-2 Vaccination 08/11/2019, 08/31/2019, 05/02/2020   Pfizer Covid-19 Vaccine Bivalent Booster 68yr & up 04/10/2021, 04/26/2022   Pneumococcal Conjugate-13 04/30/2014   Pneumococcal Polysaccharide-23 03/08/2009   Td 03/08/2009   Tdap 05/06/2020   Zoster, Live 04/09/2012  There are no preventive care reminders to display for this patient.    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 His smoking use included cigarettes. He has never used smokeless tobacco. He reports current alcohol use of about 2.0 standard drinks of alcohol per week. He reports that he does not use drugs. family history includes Arthritis in his mother; Heart disease in his father. Allergies  Allergen Reactions   Levitra [Vardenafil Hydrochloride]  headache   Lincomycin Hives   Penicillins Hives    REACTION: 40 years ago   Tadalafil     REACTION: headache   Triazolam     REACTION: hallucinations   Vardenafil Nausea And Vomiting    headache   Current Outpatient Medications on File Prior to Visit  Medication Sig Dispense Refill   acetaminophen (TYLENOL) 500 MG tablet Take 1,000 mg by mouth every 6 (six) hours as needed for mild pain or moderate pain. Rapid release     Cholecalciferol 50 MCG (2000 UT) TABS 1 tab by mouth once daily 90 tablet 3   Cyanocobalamin  (VITAMIN B-12) 5000 MCG SUBL Place 5,000 mcg under the tongue daily.     diphenhydrAMINE (BENADRYL) 25 mg capsule Take 25 mg by mouth in the morning and at bedtime. Allergies     No current facility-administered medications on file prior to visit.        ROS:  All others reviewed and negative.  Objective        PE:  BP 126/72 (BP Location: Right Arm, Patient Position: Sitting, Cuff Size: Large)   Pulse 63   Temp 98.5 F (36.9 C) (Oral)   Ht '5\' 5"'$  (1.651 m)   Wt 150 lb (68 kg)   SpO2 98%   BMI 24.96 kg/m                 Constitutional: Pt appears in NAD               HENT: Head: NCAT.                Right Ear: External ear normal.                 Left Ear: External ear normal.                Eyes: . Pupils are equal, round, and reactive to light. Conjunctivae and EOM are normal               Nose: without d/c or deformity               Neck: Neck supple. Gross normal ROM               Cardiovascular: Normal rate and regular rhythm.                 Pulmonary/Chest: Effort normal and breath sounds without rales or wheezing.                Abd:  Soft, NT, ND, + BS, no organomegaly               Neurological: Pt is alert. At baseline orientation, motor grossly intact               Skin: Skin is warm. No rashes, no other new lesions, LE edema - none               Psychiatric: Pt behavior is normal without agitation   Micro: none  Cardiac tracings I have personally interpreted today:  none  Pertinent Radiological findings (summarize): none   Lab Results  Component Value Date   WBC 8.6 05/10/2022   HGB 14.2 05/10/2022   HCT 42.4 05/10/2022   PLT 204.0 05/10/2022   GLUCOSE 100 (H) 05/10/2022   CHOL 103 05/10/2022   TRIG 111.0 05/10/2022   HDL 36.40 (L) 05/10/2022   LDLDIRECT 72.0 04/30/2018   LDLCALC 45 05/10/2022   ALT  16 05/10/2022   AST 23 05/10/2022   NA 138 05/10/2022   K 4.4 05/10/2022   CL 102 05/10/2022   CREATININE 1.09 05/10/2022   BUN 17 05/10/2022   CO2 29  05/10/2022   TSH 1.28 05/10/2022   PSA 1.73 05/09/2021   HGBA1C 6.2 05/10/2022   MICROALBUR 1.0 05/10/2022   Assessment/Plan:  Brian Branch is a 80 y.o. White or Caucasian [1] male with  has a past medical history of ALLERGIC RHINITIS (02/15/2007), Allergy, BENIGN PROSTATIC HYPERTROPHY (02/15/2007), CAD (coronary artery disease), COLONIC POLYPS, HX OF (08/29/2007), CORONARY ARTERY DISEASE (02/15/2007), DEGENERATIVE JOINT DISEASE, CERVICAL SPINE (02/15/2007), DEPRESSION (08/29/2007), ED (erectile dysfunction), FATIGUE (08/29/2007), GERD (gastroesophageal reflux disease), Heart attack ([place]), HYPERLIPIDEMIA (02/15/2007), INSOMNIA-SLEEP DISORDER-UNSPEC (03/09/2010), KNEE PAIN, LEFT (08/29/2007), MYOCARDIAL INFARCTION, HX OF (02/15/2007), SHINGLES, HX OF (02/15/2007), TOBACCO ABUSE (03/09/2010), and Unspecified eustachian tube disorder (03/09/2010).  Vitamin D deficiency Last vitamin D Lab Results  Component Value Date   VD25OH 97.67 05/10/2022   Stable, cont oral replacement   Hyperlipidemia Lab Results  Component Value Date   LDLCALC 45 05/10/2022   Stable, pt to continue current statin crestor 20 mg qd   Hyperglycemia Lab Results  Component Value Date   HGBA1C 6.2 05/10/2022   Stable, pt to continue current medical treatment  - diet, wt control, excercise   B12 deficiency Lab Results  Component Value Date   VITAMINB12 >1500 (H) 05/10/2022   Stable, cont oral replacement - b12 1000 mcg qd   Encounter for well adult exam with abnormal findings Age and sex appropriate education and counseling updated with regular exercise and diet Referrals for preventative services - declines further colonoscopy Immunizations addressed - for shingrix at the pharmacy Smoking counseling  - none needed Evidence for depression or other mood disorder - none significant Most recent labs reviewed. I have personally reviewed and have noted: 1) the patient's medical and social history 2) The patient's current  medications and supplements 3) The patient's height, weight, and BMI have been recorded in the chart   Left shoulder pain Pt with ongoing pain, has known left rotater cuff tear, pt is deferring surgury for now, for ultram ER 100 qd  Followup: No follow-ups on file.  [name], MD 05/13/2022 3:16 PM [facility]

## 2022-05-10 NOTE — Patient Instructions (Addendum)
Please let us know if you would want to see Dr Veverly Fells at [facility]  for the left shoulder  Please have your Shingrix (shingles) shots done at your local pharmacy.  Please continue all other medications as before, and refills have been done if requested.  Please have the pharmacy call with any other refills you may need.  Please continue your efforts at being more active, low cholesterol diet, and weight control.  You are otherwise up to date with prevention measures today.  Please keep your appointments with your specialists as you may have planned  Please go to the LAB at the blood drawing area for the tests to be done  You will be contacted by phone if any changes need to be made immediately.  Otherwise, you will receive a letter about your results with an explanation, but please check with MyChart first.  Please remember to sign up for MyChart if you have not done so, as this will be important to you in the future with finding out test results, communicating by private email, and scheduling acute appointments online when needed.  Please make an Appointment to return for your 1 year visit, or sooner if needed

## 2022-05-13 ENCOUNTER — Encounter: Payer: Self-pay | Admitting: Internal Medicine

## 2022-05-13 MED ORDER — TRAMADOL HCL ER 100 MG PO TB24: 100.0000 mg | ORAL_TABLET | Freq: Every day | ORAL | 0 refills | Status: DC

## 2022-05-13 NOTE — Assessment & Plan Note (Signed)
Lab Results  Component Value Date   VITAMINB12 >1500 (H) 05/10/2022   Stable, cont oral replacement - b12 1000 mcg qd

## 2022-05-13 NOTE — Assessment & Plan Note (Signed)
Age and sex appropriate education and counseling updated with regular exercise and diet Referrals for preventative services - declines further colonoscopy Immunizations addressed - for shingrix at the pharmacy Smoking counseling  - none needed Evidence for depression or other mood disorder - none significant Most recent labs reviewed. I have personally reviewed and have noted: 1) the patient's medical and social history 2) The patient's current medications and supplements 3) The patient's height, weight, and BMI have been recorded in the chart

## 2022-05-13 NOTE — Assessment & Plan Note (Signed)
Lab Results  Component Value Date   LDLCALC 45 05/10/2022   Stable, pt to continue current statin crestor 20 mg qd

## 2022-05-13 NOTE — Assessment & Plan Note (Signed)
Pt with ongoing pain, has known left rotater cuff tear, pt is deferring surgury for now, for ultram ER 100 qd

## 2022-05-13 NOTE — Assessment & Plan Note (Signed)
Lab Results  Component Value Date   HGBA1C 6.2 05/10/2022   Stable, pt to continue current medical treatment  - diet, wt control, excercise

## 2022-05-13 NOTE — Assessment & Plan Note (Signed)
Last vitamin D Lab Results  Component Value Date   VD25OH 97.67 05/10/2022   Stable, cont oral replacement

## 2022-05-23 ENCOUNTER — Other Ambulatory Visit: Payer: Self-pay

## 2022-05-23 MED ORDER — TRAMADOL HCL ER 100 MG PO TB24: 100.0000 mg | ORAL_TABLET | Freq: Every day | ORAL | 1 refills | Status: DC

## 2022-05-23 NOTE — Telephone Encounter (Signed)
Pt was seen 05/10/22, was filled 05/13/22

## 2022-10-22 IMAGING — MR MR SHOULDER*L* W/O CM
5 series · 34 of 40 positions shown · non-contrast
Comparison: Left shoulder radiographs 10/25/2021

CLINICAL DATA: Chronic left shoulder pain. Osteoarthritis
suspected.

EXAM:
MRI OF THE LEFT SHOULDER WITHOUT CONTRAST
TECHNIQUE: Multiplanar, multisequence MR imaging of the shoulder was performed.
No intravenous contrast was administered.

[Series 3: T2 fat-sat · axial · 4.0mm · 0.55mm/px · z∈[-56,+63]mm · 8 of 26 slices shown (1 of 3)]
[im 1/26]
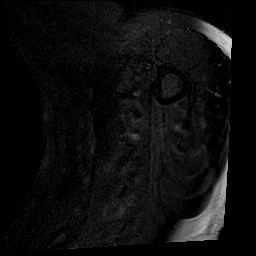
[im 4/26]
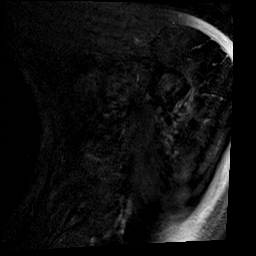
[im 8/26]
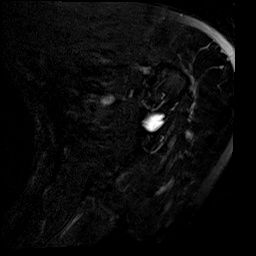
[im 11/26]
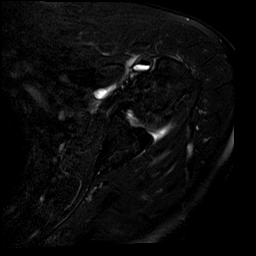
[im 15/26]
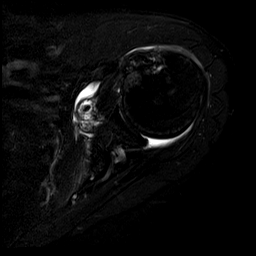
[im 18/26]
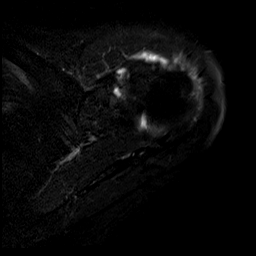
[im 22/26]
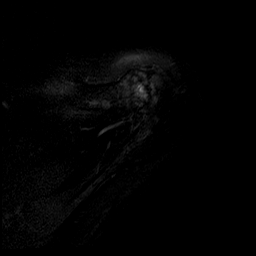
[im 26/26]
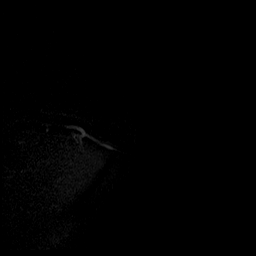

[Series 4: T2 fat-sat · oblique · 4.0mm · 0.55mm/px · 7 of 19 slices shown (2 of 3)]
[im 1/19]
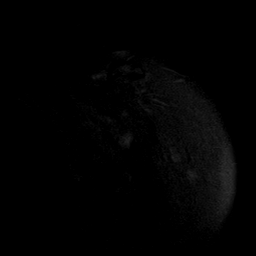
[im 4/19]
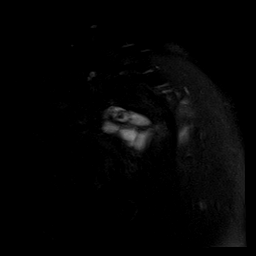
[im 7/19]
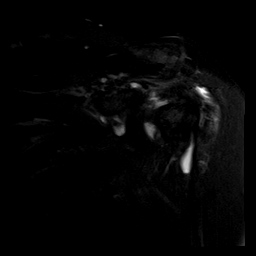
[im 10/19]
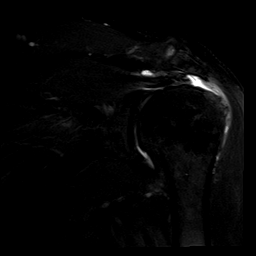
[im 13/19]
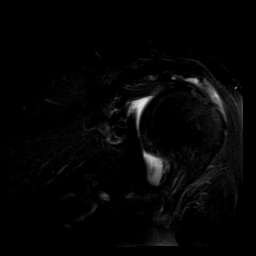
[im 16/19]
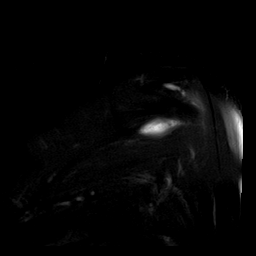
[im 19/19]
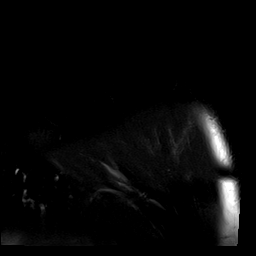

[Series 5: PD · oblique · 4.0mm · 0.27mm/px · 7 of 19 slices shown]
[im 1/19]
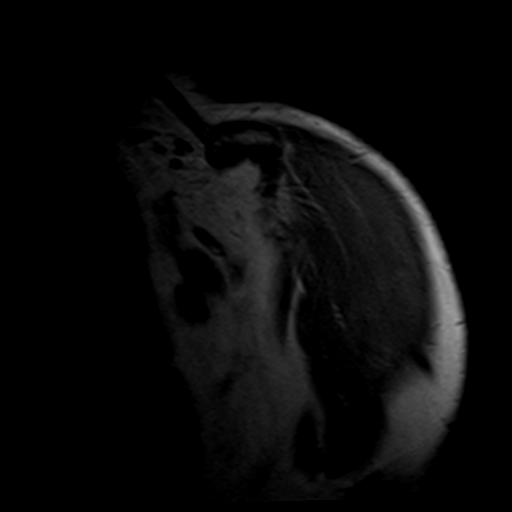
[im 4/19]
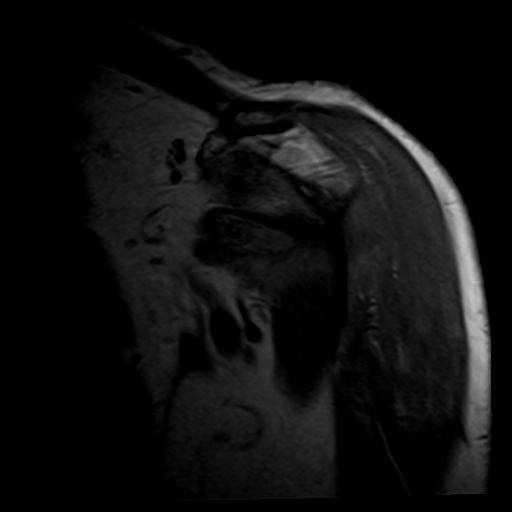
[im 7/19]
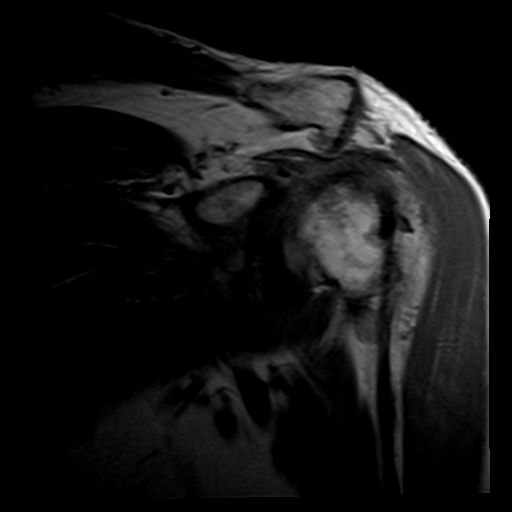
[im 10/19]
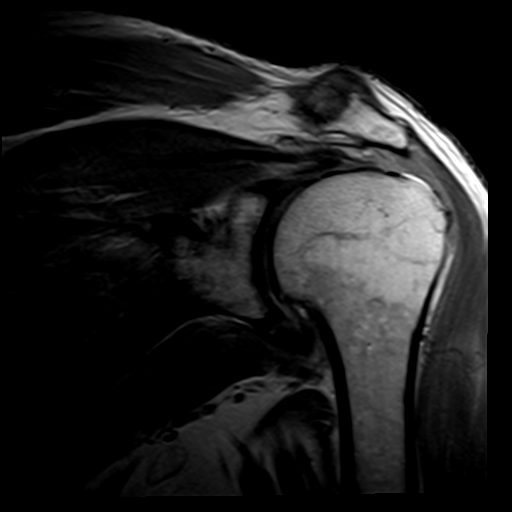
[im 13/19]
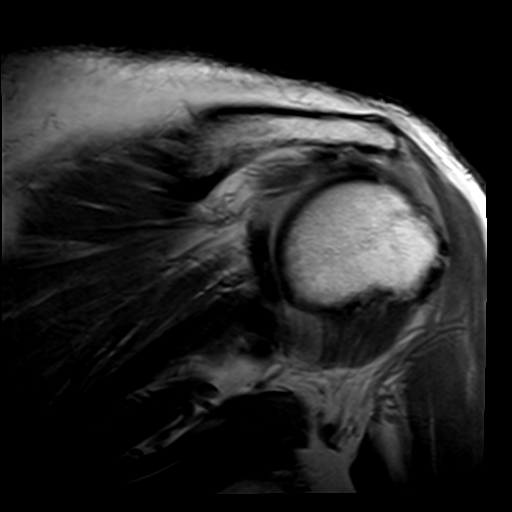
[im 16/19]
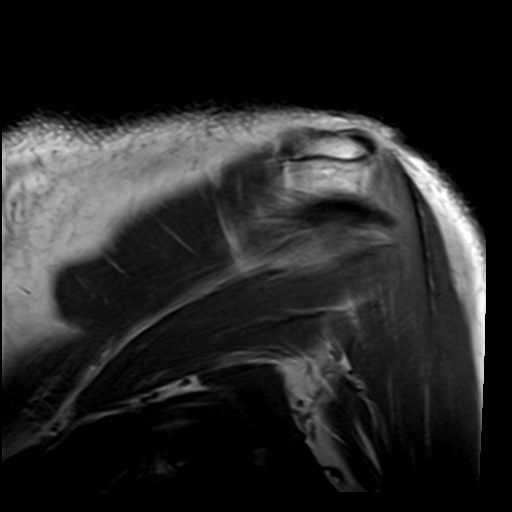
[im 19/19]
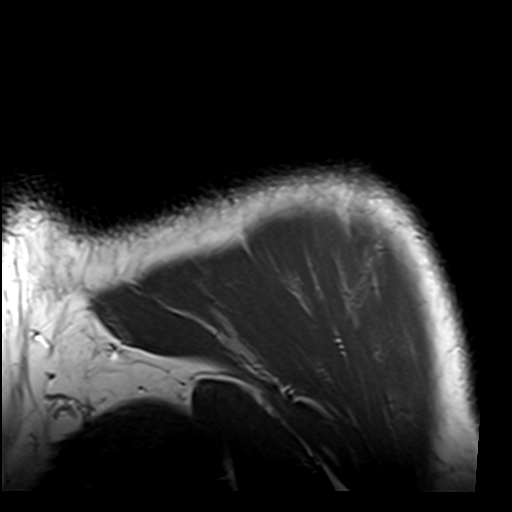

[Series 6: T2 fat-sat · oblique · 4.0mm · 0.55mm/px · 9 of 25 slices shown (3 of 3)]
[im 1/25]
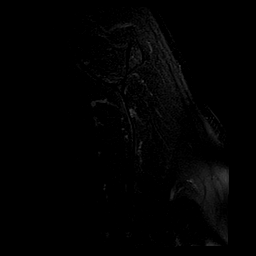
[im 4/25]
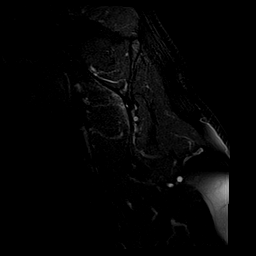
[im 7/25]
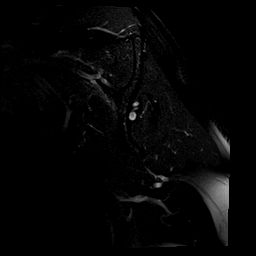
[im 10/25]
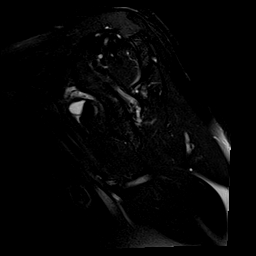
[im 13/25]
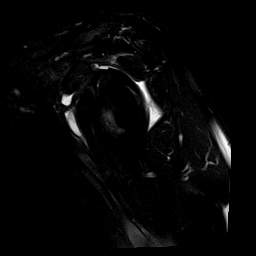
[im 16/25]
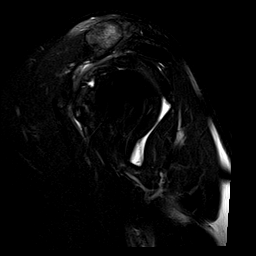
[im 19/25]
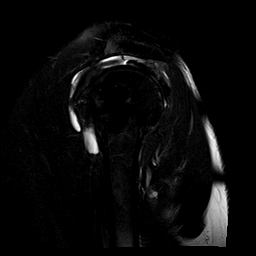
[im 22/25]
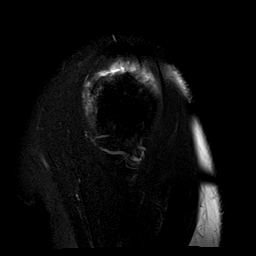
[im 25/25]
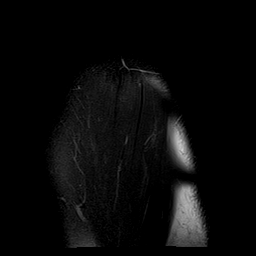

[Series 7: T1 · oblique · 4.0mm · 0.27mm/px · 3 of 25 slices shown]
[im 1/25]
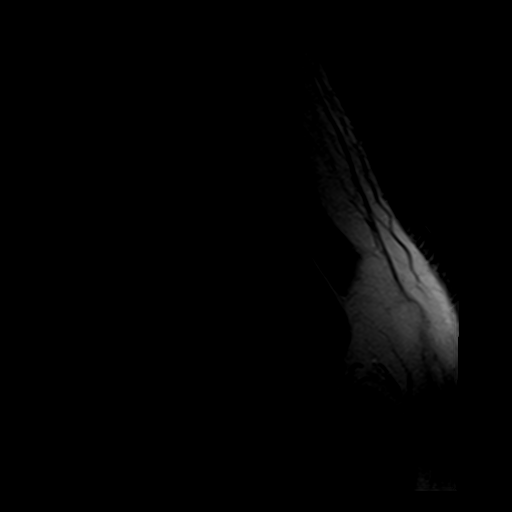
[im 4/25]
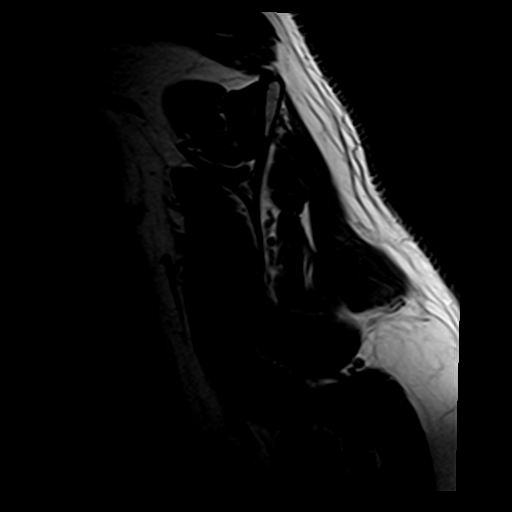
[im 7/25]
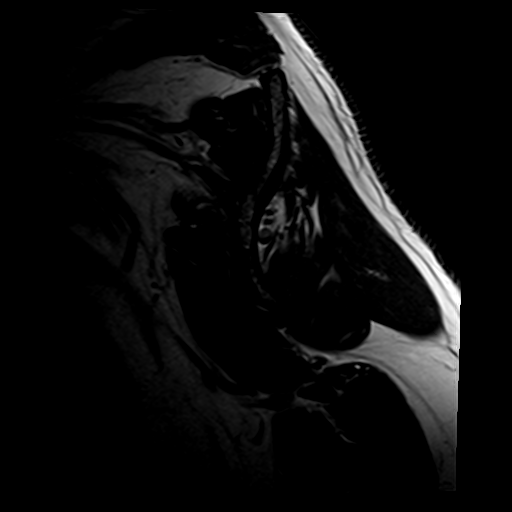

[34 of 40 positions shown; findings below may reference images not displayed]

FINDINGS: Despite efforts by the technologist and patient, motion artifact is
present on today's exam and could not be eliminated. This reduces
exam sensitivity and specificity.

Rotator cuff: There is a full-thickness tear of the mid and anterior
aspect of the supraspinatus tendon footprint and region measuring up
to 2.1 cm in AP dimension (sagittal image 6) with up to 19 mm tendon
retraction (coronal series 4 images 9 through 12). There is moderate
intermediate T2 signal tendinosis of the infraspinatus diffusely
with mild-to-moderate bursal aspect of the infraspinatus
partial-thickness tearing. Moderate partial-thickness tear of the
superior subscapularis tendon midsubstance (axial series 3 images 9
through 12). The teres minor is intact.

Muscles: Mild-to-moderate supraspinatus and anterior infraspinatus
and minimal superior subscapularis muscle atrophy.

Biceps long head: The superior subscapularis tendon tear allows the
long head of the biceps tendon to be perched on the anterior
superior aspect of the lesser tuberosity at the biceps tendon enters
the bicipital groove. Mild-to-moderate proximal long head of the
biceps tendinosis.

Acromioclavicular Joint: There are moderate to severe degenerative
changes of the acromioclavicular joint including joint space
narrowing, subchondral marrow edema, and peripheral osteophytosis.
Type II acromion.

Glenohumeral Joint: Mild-to-moderate glenoid and humeral head
cartilage thinning. Mild inferior humeral head-neck junction
degenerative osteophytosis.

Labrum: Mild posterior and superior glenoid labrum degenerative
irregularity.

Bones:  No acute fracture.

Other: None.
IMPRESSION: 1. Full-thickness tear of the anterior 2.1 cm of the supraspinatus
tendon footprint.
2. Mild-to-moderate infraspinatus tendinosis diffusely with
mild-to-moderate partial-thickness tearing of the bursal aspect of
the infraspinatus tendon fibers.
3. Moderate partial-thickness tear of the superior subscapularis
tendon insertion.
4. This allows the long head of the biceps tendon to be perched on
the anterior superior aspect of the lesser tuberosity at the biceps
tendon enters the bicipital groove. Mild-to-moderate proximal long
head of the biceps tendinosis.
5. Mild-to-moderate glenoid and humeral head cartilage thinning.
Moderate to severe acromioclavicular osteoarthritis.

## 2022-10-30 ENCOUNTER — Telehealth: Payer: Medicare Other | Admitting: Physician Assistant

## 2022-10-30 DIAGNOSIS — J028 Acute pharyngitis due to other specified organisms: Secondary | ICD-10-CM

## 2022-10-30 DIAGNOSIS — B9689 Other specified bacterial agents as the cause of diseases classified elsewhere: Secondary | ICD-10-CM | POA: Diagnosis not present

## 2022-10-30 MED ORDER — DOXYCYCLINE HYCLATE 100 MG PO TABS: 100.0000 mg | ORAL_TABLET | Freq: Two times a day (BID) | ORAL | 0 refills | Status: DC

## 2022-10-30 NOTE — Progress Notes (Signed)
Virtual Visit Consent   Brian Branch, you are scheduled for a virtual visit with a [facility] provider today. Just as with appointments in the office, your consent must be obtained to participate. Your consent will be active for this visit and any virtual visit you may have with one of our providers in the next 365 days. If you have a MyChart account, a copy of this consent can be sent to you electronically.  As this is a virtual visit, video technology does not allow for your provider to perform a traditional examination. This may limit your provider's ability to fully assess your condition. If your provider identifies any concerns that need to be evaluated in person or the need to arrange testing (such as labs, EKG, etc.), we will make arrangements to do so. Although advances in technology are sophisticated, we cannot ensure that it will always work on either your end or our end. If the connection with a video visit is poor, the visit may have to be switched to a telephone visit. With either a video or telephone visit, we are not always able to ensure that we have a secure connection.  By engaging in this virtual visit, you consent to the provision of healthcare and authorize for your insurance to be billed (if applicable) for the services provided during this visit. Depending on your insurance coverage, you may receive a charge related to this service.  I need to obtain your verbal consent now. Are you willing to proceed with your visit today? JAYCEON ERDOS has provided verbal consent on 10/30/2022 for a virtual visit (video or telephone). [name], PA-C  Date: 10/30/2022 9:20 AM  Virtual Visit via Video Note   I, [name], connected with  Brian Branch  ([id number], 1940-09-17) on 10/30/22 at  9:15 AM EDT by a video-enabled telemedicine application and verified that I am speaking with the correct person using two identifiers.  Location: Patient: Virtual Visit  Location Patient: Home Provider: Virtual Visit Location Provider: Home Office   I discussed the limitations of evaluation and management by telemedicine and the availability of in person appointments. The patient expressed understanding and agreed to proceed.    History of Present Illness: Brian Branch is a 82 y.o. who identifies as a male who was assigned male at birth, and is being seen today for sore throat.  HPI: Sore Throat  This is a new problem. The current episode started in the past 7 days. The problem has been gradually worsening. Maximum temperature: subjective fever yesterday. The fever has been present for Less than 1 day. Associated symptoms include congestion, headaches, a hoarse voice, swollen glands (slightly tender) and trouble swallowing. Pertinent negatives include no coughing, ear discharge, ear pain, plugged ear sensation or neck pain. He has tried gargles for the symptoms. The treatment provided no relief.  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   Low back pain 10/20/2015   PVC's (premature ventricular contractions)  07/04/2015   Hoarseness 04/22/2015   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   Bilateral shoulder pain 04/09/2012   Pain of right heel 04/09/2012   Change in voice 04/09/2012   Fatigue 04/09/2011   Bladder neck obstruction 04/09/2011   Encounter for well adult exam with abnormal findings 04/06/2011   TOBACCO ABUSE 03/09/2010   INSOMNIA-SLEEP  DISORDER-UNSPEC 03/09/2010   Depression 08/29/2007   COLONIC POLYPS, HX OF 08/29/2007   Hyperlipidemia 02/15/2007   MYOCARDIAL INFARCTION, 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   CORONARY ARTERY DISEASE 02/15/2007   Allergic rhinitis 02/15/2007   BPH (benign prostatic hyperplasia) 02/15/2007   DEGENERATIVE JOINT DISEASE, 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 Rapid release, Disp: , Rfl:    aspirin EC (ASPIRIN LOW DOSE) 81 MG tablet, TAKE 1 TABLET DAILY (SWALLOW WHOLE), Disp: 90 tablet, Rfl: 3   Cholecalciferol 50 MCG (2000 UT) TABS, 1 tab by mouth once daily, Disp: 90 tablet, Rfl: 3   Cyanocobalamin (VITAMIN B-12) 5000 MCG SUBL, Place 5,000 mcg under the tongue daily., Disp: , Rfl:    diphenhydrAMINE (BENADRYL) 25 mg capsule, Take 25 mg by mouth in the morning and at bedtime. Allergies, Disp: , Rfl:    doxycycline (VIBRA-TABS) 100 MG tablet, Take 1 tablet (100 mg total) by mouth 2 (two) times daily., Disp: 20 tablet, Rfl: 0   fluticasone (FLONASE) 50 MCG/ACT nasal spray, Place 2 sprays into both nostrils in the morning and at bedtime., Disp: 48 g, Rfl: 3   omeprazole (PRILOSEC) 40 MG capsule, Take 1 capsule (40 mg total) by mouth in the morning and at bedtime., Disp: 180 capsule, Rfl: 3   rosuvastatin (CRESTOR) 20 MG tablet, Take 1 tablet (20 mg total) by mouth at bedtime., Disp: 90 tablet, Rfl: 3   solifenacin (VESICARE) 5 MG tablet, Take 1 tablet (5 mg total) by mouth daily., Disp: 90 tablet, Rfl: 3   tamsulosin (FLOMAX) 0.4 MG CAPS  capsule, Take 1 capsule (0.4 mg total) by mouth daily., Disp: 90 capsule, Rfl: 3   traMADol (ULTRAM-ER) 100 MG 24 hr tablet, Take 1 tablet (100 mg total) by mouth daily., Disp: 30 tablet, Rfl: 1  Observations/Objective: Patient is well-developed, well-nourished in no acute distress.  Resting comfortably at home.  Head is normocephalic, atraumatic.  No labored breathing.  Speech is clear and coherent with logical content.  Patient is alert and oriented at baseline.    Assessment and Plan: 1. Acute bacterial pharyngitis - doxycycline (VIBRA-TABS) 100 MG tablet; Take 1 tablet (100 mg total) by mouth 2 (two) times daily.  Dispense: 20 tablet; Refill: 0  - Suspect bacterial pharyngitis - Doxycycline prescribed - Tylenol and Ibuprofen alternating every 4 hours - Salt water gargles - Chloraseptic spray - Liquid and soft food diet - Push fluids - New toothbrush in 3 days - Seek in person evaluation if not improving or if symptoms worsen    Follow Up Instructions: I discussed the assessment and treatment  plan with the patient. The patient was provided an opportunity to ask questions and all were answered. The patient agreed with the plan and demonstrated an understanding of the instructions.  A copy of instructions were sent to the patient via MyChart unless otherwise noted below.    The patient was advised to call back or seek an in-person evaluation if the symptoms worsen or if the condition fails to improve as anticipated.  Time:  I spent 10 minutes with the patient via telehealth technology discussing the above problems/concerns.    Mar Daring, PA-C

## 2022-10-30 NOTE — Patient Instructions (Signed)
Brian CorrenteAndrew J Branch, thank you for joining Brian LovelessJennifer M Zakira Ressel, PA-C for today's virtual visit.  While this provider is not your primary care provider (PCP), if your PCP is located in our provider database this encounter information will be shared with them immediately following your visit.   A [facility] MyChart account gives you access to today's visit and all your visits, tests, and labs performed at [facility] " click here if you don't have a [facility] MyChart account or go to [email or web address]  Consent: (Patient) Brian Branch provided verbal consent for this virtual visit at the beginning of the encounter.  Current Medications:  Current Outpatient Medications:    acetaminophen (TYLENOL) 500 MG tablet, Take 1,000 mg by mouth every 6 (six) hours as needed for mild pain or moderate pain. Rapid release, Disp: , Rfl:    aspirin EC (ASPIRIN LOW DOSE) 81 MG tablet, TAKE 1 TABLET DAILY (SWALLOW WHOLE), Disp: 90 tablet, Rfl: 3   Cholecalciferol 50 MCG (2000 UT) TABS, 1 tab by mouth once daily, Disp: 90 tablet, Rfl: 3   Cyanocobalamin (VITAMIN B-12) 5000 MCG SUBL, Place 5,000 mcg under the tongue daily., Disp: , Rfl:    diphenhydrAMINE (BENADRYL) 25 mg capsule, Take 25 mg by mouth in the morning and at bedtime. Allergies, Disp: , Rfl:    doxycycline (VIBRA-TABS) 100 MG tablet, Take 1 tablet (100 mg total) by mouth 2 (two) times daily., Disp: 20 tablet, Rfl: 0   fluticasone (FLONASE) 50 MCG/ACT nasal spray, Place 2 sprays into both nostrils in the morning and at bedtime., Disp: 48 g, Rfl: 3   omeprazole (PRILOSEC) 40 MG capsule, Take 1 capsule (40 mg total) by mouth in the morning and at bedtime., Disp: 180 capsule, Rfl: 3   rosuvastatin (CRESTOR) 20 MG tablet, Take 1 tablet (20 mg total) by mouth at bedtime., Disp: 90 tablet, Rfl: 3   solifenacin (VESICARE) 5 MG tablet, Take 1 tablet (5 mg total) by mouth daily., Disp: 90 tablet, Rfl: 3   tamsulosin (FLOMAX) 0.4 MG CAPS  capsule, Take 1 capsule (0.4 mg total) by mouth daily., Disp: 90 capsule, Rfl: 3   traMADol (ULTRAM-ER) 100 MG 24 hr tablet, Take 1 tablet (100 mg total) by mouth daily., Disp: 30 tablet, Rfl: 1   Medications ordered in this encounter:  Meds ordered this encounter  Medications   doxycycline (VIBRA-TABS) 100 MG tablet    Sig: Take 1 tablet (100 mg total) by mouth 2 (two) times daily.    Dispense:  20 tablet    Refill:  0    Order Specific Question:   Supervising Provider    Answer:   Merrilee JanskyLAMPTEY, PHILIP O [id number]]     *If you need refills on other medications prior to your next appointment, please contact your pharmacy*  Follow-Up: Call back or seek an in-person evaluation if the symptoms worsen or if the condition fails to improve as anticipated.  [facility] [phone]  Other Instructions  Pharyngitis  Pharyngitis is inflammation of the throat (pharynx). It is a very common cause of sore throat. Pharyngitis can be caused by a bacteria, but it is usually caused by a virus. Most cases of pharyngitis get better on their own without treatment. What are the causes? This condition may be caused by: Infection by viruses (viral). Viral pharyngitis spreads easily from person to person (is contagious) through coughing, sneezing, and sharing of personal items or utensils such as cups, forks, spoons, and toothbrushes. Infection by  bacteria (bacterial). Bacterial pharyngitis may be spread by touching the nose or face after coming in contact with the bacteria, or through close contact, such as kissing. Allergies. Allergies can cause buildup of mucus in the throat (post-nasal drip), leading to inflammation and irritation. Allergies can also cause blocked nasal passages, forcing breathing through the mouth, which dries and irritates the throat. What increases the risk? You are more likely to develop this condition if: You are 11-75 years old. You are exposed to crowded environments  such as daycare, school, or dormitory living. You live in a cold climate. You have a weakened disease-fighting (immune) system. What are the signs or symptoms? Symptoms of this condition vary by the cause. Common symptoms of this condition include: Sore throat. Fatigue. Low-grade fever. Stuffy nose (nasal congestion) and cough. Headache. Other symptoms may include: Glands in the neck (lymph nodes) that are swollen. Skin rashes. Plaque-like film on the throat or tonsils. This is often a symptom of bacterial pharyngitis. Vomiting. Red, itchy eyes (conjunctivitis). Loss of appetite. Joint pain and muscle aches. Enlarged tonsils. How is this diagnosed? This condition may be diagnosed based on your medical history and a physical exam. Your health care provider will ask you questions about your illness and your symptoms. A swab of your throat may be done to check for bacteria (rapid strep test). Other lab tests may also be done, depending on the suspected cause, but these are rare. How is this treated? Many times, treatment is not needed for this condition. Pharyngitis usually gets better in 3-4 days without treatment. Bacterial pharyngitis may be treated with antibiotic medicines. Follow these instructions at home: Medicines Take over-the-counter and prescription medicines only as told by your health care provider. If you were prescribed an antibiotic medicine, take it as told by your health care provider. Do not stop taking the antibiotic even if you start to feel better. Use throat sprays to soothe your throat as told by your health care provider. Children can get pharyngitis. Do not give your child aspirin because of the association with Reye's syndrome. Managing pain To help with pain, try: Sipping warm liquids, such as broth, herbal tea, or warm water. Eating or drinking cold or frozen liquids, such as frozen ice pops. Gargling with a mixture of salt and water 3-4 times a day or as  needed. To make salt water, completely dissolve -1 tsp (3-6 g) of salt in 1 cup (237 mL) of warm water. Sucking on hard candy or throat lozenges. Putting a cool-mist humidifier in your bedroom at night to moisten the air. Sitting in the bathroom with the door closed for 5-10 minutes while you run hot water in the shower.  General instructions  Do not use any products that contain nicotine or tobacco. These products include cigarettes, chewing tobacco, and vaping devices, such as e-cigarettes. If you need help quitting, ask your health care provider. Rest as told by your health care provider. Drink enough fluid to keep your urine pale yellow. How is this prevented? To help prevent becoming infected or spreading infection: Wash your hands often with soap and water for at least 20 seconds. If soap and water are not available, use hand sanitizer. Do not touch your eyes, nose, or mouth with unwashed hands, and wash hands after touching these areas. Do not share cups or eating utensils. Avoid close contact with people who are sick. Contact a health care provider if: You have large, tender lumps in your neck. You have a rash.  You cough up green, yellow-brown, or bloody mucus. Get help right away if: Your neck becomes stiff. You drool or are unable to swallow liquids. You cannot drink or take medicines without vomiting. You have severe pain that does not go away, even after you take medicine. You have trouble breathing, and it is not caused by a stuffy nose. You have new pain and swelling in your joints such as the knees, ankles, wrists, or elbows. These symptoms may represent a serious problem that is an emergency. Do not wait to see if the symptoms will go away. Get medical help right away. Call your local emergency services (911 in the [place] Do not drive yourself to the hospital. Summary Pharyngitis is redness, pain, and swelling (inflammation) of the throat (pharynx). While pharyngitis  can be caused by a bacteria, the most common causes are viral. Most cases of pharyngitis get better on their own without treatment. Bacterial pharyngitis is treated with antibiotic medicines. This information is not intended to replace advice given to you by your health care provider. Make sure you discuss any questions you have with your health care provider. Document Revised: 10/05/2020 Document Reviewed: 10/05/2020 Elsevier Patient Education  2023 Elsevier Inc.    If you have been instructed to have an in-person evaluation today at a local Urgent Care facility, please use the link below. It will take you to a list of all of our available [facility], including address, phone number and hours of operation. Please do not delay care.  [facility]  If you or a family member do not have a primary care provider, use the link below to schedule a visit and establish care. When you choose a [facility] primary care physician or advanced practice provider, you gain a long-term partner in health. Find a Primary Care Provider  Learn more about [facility]'s in-office and virtual care options: [facility] - Get Care Now

## 2022-11-07 ENCOUNTER — Encounter: Payer: Self-pay | Admitting: Internal Medicine

## 2022-11-07 NOTE — Telephone Encounter (Signed)
Per chart no history of DM is this ok to send.Marland KitchenRaechel Chute

## 2022-11-08 NOTE — Telephone Encounter (Signed)
Noted../lmb 

## 2022-11-08 NOTE — Telephone Encounter (Signed)
Ok done hardcopy to CMA  Please be aware he is referring to his wife who has DM

## 2022-11-15 ENCOUNTER — Telehealth: Payer: Self-pay | Admitting: Internal Medicine

## 2022-11-15 NOTE — Telephone Encounter (Signed)
Contacted Brian Branch to schedule their annual wellness visit. Appointment made for 11/26/2022.  [facility] Care Guide [facility] AWV TEAM Direct Dial: [phone]

## 2022-11-26 ENCOUNTER — Ambulatory Visit (INDEPENDENT_AMBULATORY_CARE_PROVIDER_SITE_OTHER): Payer: Medicare Other

## 2022-11-26 VITALS — Ht 65.0 in | Wt 160.0 lb

## 2022-11-26 DIAGNOSIS — Z0000 Encounter for general adult medical examination without abnormal findings: Secondary | ICD-10-CM

## 2022-11-26 NOTE — Progress Notes (Signed)
I connected with  [name] on 11/26/22 by a audio enabled telemedicine application and verified that I am speaking with the correct person using two identifiers.  Patient Location: Home  Provider Location: Office/Clinic  I discussed the limitations of evaluation and management by telemedicine. The patient expressed understanding and agreed to proceed.  Subjective:   Brian Branch is a 82 y.o. male who presents for Medicare Annual/Subsequent preventive examination.  Review of Systems     Cardiac Risk Factors include: advanced age (>66men, >65 women);dyslipidemia;family history of premature cardiovascular disease;male gender;sedentary lifestyle     Objective:    Today's Vitals   11/26/22 0917  Weight: 160 lb (72.6 kg)  Height: 5\' 5"  (1.651 m)  PainSc: 0-No pain   Body mass index is 26.63 kg/m.     11/26/2022    9:18 AM 11/22/2021    8:54 AM 10/30/2021   10:22 AM 05/26/2020   10:29 AM 11/11/2019    2:57 PM 11/06/2018    2:36 PM  Advanced Directives  Does Patient Have a Medical Advance Directive? Yes Yes Yes Yes Yes Yes  Type of Estate agent of [place];Living will Healthcare Power of [place];Living will Healthcare Power of [place];Living will Healthcare Power of [place];Living will Healthcare Power of [place];Living will Healthcare Power of [place];Living will  Does patient want to make changes to medical advance directive?  No - Patient declined   No - Patient declined   Copy of Healthcare Power of Attorney in Chart? No - copy requested No - copy requested No - copy requested No - copy requested No - copy requested No - copy requested    Current Medications (verified) Outpatient Encounter Medications as of 11/26/2022  Medication Sig   acetaminophen (TYLENOL) 500 MG tablet Take 1,000 mg by mouth every 6 (six) hours as needed for mild pain or moderate pain. Rapid release   aspirin EC (ASPIRIN LOW DOSE) 81 MG tablet TAKE 1 TABLET DAILY (SWALLOW  WHOLE)   Cholecalciferol 50 MCG (2000 UT) TABS 1 tab by mouth once daily   Cyanocobalamin (VITAMIN B-12) 5000 MCG SUBL Place 5,000 mcg under the tongue daily.   diphenhydrAMINE (BENADRYL) 25 mg capsule Take 25 mg by mouth in the morning and at bedtime. Allergies   doxycycline (VIBRA-TABS) 100 MG tablet Take 1 tablet (100 mg total) by mouth 2 (two) times daily.   fluticasone (FLONASE) 50 MCG/ACT nasal spray Place 2 sprays into both nostrils in the morning and at bedtime.   omeprazole (PRILOSEC) 40 MG capsule Take 1 capsule (40 mg total) by mouth in the morning and at bedtime.   rosuvastatin (CRESTOR) 20 MG tablet Take 1 tablet (20 mg total) by mouth at bedtime.   solifenacin (VESICARE) 5 MG tablet Take 1 tablet (5 mg total) by mouth daily.   tamsulosin (FLOMAX) 0.4 MG CAPS capsule Take 1 capsule (0.4 mg total) by mouth daily.   traMADol (ULTRAM-ER) 100 MG 24 hr tablet Take 1 tablet (100 mg total) by mouth daily.   No facility-administered encounter medications on file as of 11/26/2022.    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   HYPERLIPIDEMIA 02/15/2007   INSOMNIA-SLEEP DISORDER-UNSPEC 03/09/2010   KNEE PAIN, LEFT 08/29/2007   MYOCARDIAL INFARCTION, 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   SHINGLES, 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   TOBACCO ABUSE 03/09/2010   Unspecified eustachian tube disorder 03/09/2010   Past Surgical History:  Procedure Laterality Date   APPENDECTOMY  1951    CARDIAC CATHETERIZATION     1991- [place]   COLONOSCOPY     CORONARY ARTERY BYPASS GRAFT  1991   MICROLARYNGOSCOPY N/A 06/01/2020   Procedure: SUSPENDED MICRO DIRECT LARYNGOSCOPY WITH PROLARYN INJECTIONS & JET VENTILATION;  Surgeon: Christia Reading, MD;  [facility];  Service: ENT;  Laterality: N/A;   POLYPECTOMY     TONSILLECTOMY AND ADENOIDECTOMY     VASECTOMY  1978   Family History  Problem Relation Age of Onset   Arthritis Mother        Rhuematoid   Heart disease Father    Colon cancer Neg Hx    Esophageal cancer Neg Hx    Rectal cancer Neg Hx    Stomach cancer Neg Hx    Pancreatic cancer Neg Hx    Prostate cancer Neg Hx    Colon polyps Neg Hx    Social History   Socioeconomic History   Marital status: Married    Spouse name: Not on file   Number of children: 1   Years of education: Not on file   Highest education level: Not on file  Occupational History   Not on file  Tobacco Use   Smoking status: Former    Types: Cigarettes    Quit date: 07/23/2008    Years since quitting: 14.3   Smokeless tobacco: Never  Vaping Use   Vaping Use: Never used  Substance and Sexual Activity   Alcohol use: Yes    Alcohol/week: 2.0 standard drinks of alcohol    Types: 2 Cans of beer per week    Comment: occassionally I will drink about 2 beers   Drug use: No   Sexual activity: Not Currently  Other Topics Concern   Not on file  Social History Narrative   Not on file   Social Determinants of Health   Financial Resource Strain: Low Risk  (11/26/2022)   Overall Financial Resource Strain (CARDIA)    Difficulty of Paying Living Expenses: Not hard at all  Food Insecurity: No Food Insecurity (10/30/2021)   Hunger Vital Sign    Worried About Running Out of Food in the Last Year: Never true    Ran Out of Food in the Last Year: Never true  Transportation Needs: No Transportation Needs (11/26/2022)   PRAPARE - Administrator, Civil Service (Medical): No    Lack of  Transportation (Non-Medical): No  Physical Activity: Inactive (11/26/2022)   Exercise Vital Sign    Days of Exercise per Week: 0 days    Minutes of Exercise per Session: 0 min  Stress: No Stress Concern Present (11/26/2022)   [employer] of Occupational Health - Occupational Stress Questionnaire    Feeling of Stress : Not at all  Social Connections: Moderately Isolated (11/26/2022)   Social Connection and Isolation Panel [NHANES]    Frequency of Communication with Friends and Family: Twice a week    Frequency of Social Gatherings with Friends and Family: Twice a week  Attends Religious Services: Never    Active Member of Clubs or Organizations: No    Attends Banker Meetings: Never    Marital Status: Married    Tobacco Counseling Counseling given: Not Answered   Clinical Intake:  Pre-visit preparation completed: Yes  Pain : No/denies pain Pain Score: 0-No pain     BMI - recorded: 26.63 Nutritional Status: BMI 25 -29 Overweight Nutritional Risks: None Diabetes: No  How often do you need to have someone help you when you read instructions, pamphlets, or other written materials from your doctor or pharmacy?: 1 - Never What is the last grade level you completed in school?: HSG  Diabetic? No  Interpreter Needed?: No  Information entered by :: Susie Cassette, LPN.   Activities of Daily Living    11/26/2022    9:22 AM  In your present state of health, do you have any difficulty performing the following activities:  Hearing? 0  Vision? 0  Difficulty concentrating or making decisions? 0  Walking or climbing stairs? 0  Dressing or bathing? 0  Doing errands, shopping? 0  Preparing Food and eating ? N  Using the Toilet? N  In the past six months, have you accidently leaked urine? N  Do you have problems with loss of bowel control? N  Managing your Medications? N  Managing your Finances? N  Housekeeping or managing your Housekeeping? N    Patient  Care Team: Corwin Levins, MD as PCP - General Judi Saa, DO as Consulting Physician (Family Medicine) Meryl Dare, MD as Consulting Physician (Gastroenterology)  Indicate any recent Medical Services you may have received from other than [facility] providers in the past year (date may be approximate).     Assessment:   This is a routine wellness examination for [place].  Hearing/Vision screen Hearing Screening - Comments:: Denies hearing difficulties   Vision Screening - Comments:: OTC readers; no eye exam.  Dietary issues and exercise activities discussed: Current Exercise Habits: The patient does not participate in regular exercise at present, Exercise limited by: cardiac condition(s);orthopedic condition(s)   Goals Addressed             This Visit's Progress    My goal is to keep waking up every morning.        Depression Screen    11/26/2022    9:20 AM 05/10/2022    9:40 AM 05/10/2022    9:01 AM 10/30/2021   10:25 AM 10/30/2021   10:21 AM 05/09/2021    8:18 AM 05/09/2021    8:06 AM  PHQ 2/9 Scores  PHQ - 2 Score 0 1 0 0 0 0 0  PHQ- 9 Score   0        Fall Risk    11/26/2022    9:19 AM 05/10/2022    9:35 AM 05/10/2022    9:01 AM 10/30/2021   10:23 AM 05/09/2021    8:18 AM  Fall Risk   Falls in the past year? 0 1 1 0 0  Number falls in past yr: 0 0 0 0 0  Injury with Fall? 0 1 1 0   Risk for fall due to : No Fall Risks  Other (Comment)    Follow up Falls prevention discussed  Falls evaluation completed Falls evaluation completed     FALL RISK PREVENTION PERTAINING TO THE HOME:  Any stairs in or around the home? Yes  If so, are there any without handrails? No  Home free  of loose throw rugs in walkways, pet beds, electrical cords, etc? Yes  Adequate lighting in your home to reduce risk of falls? Yes   ASSISTIVE DEVICES UTILIZED TO PREVENT FALLS:  Life alert? No  Use of a cane, walker or w/c? No  Grab bars in the bathroom? Yes  Shower chair or bench  in shower? No  Elevated toilet seat or a handicapped toilet? No   TIMED UP AND GO:  Was the test performed? No . Telephonic Visit  Cognitive Function:        11/26/2022    9:20 AM 11/11/2019    3:00 PM  6CIT Screen  What Year? 0 points 0 points  What month? 0 points 0 points  What time? 0 points 0 points  Count back from 20 0 points 0 points  Months in reverse 0 points 0 points  Repeat phrase 0 points 0 points  Total Score 0 points 0 points    Immunizations Immunization History  Administered Date(s) Administered   Fluad Quad(high Dose 65+) 05/06/2019, 05/06/2020   Influenza Split 04/13/2011, 04/09/2012   Influenza Whole 04/12/2010   Influenza, High Dose Seasonal PF 04/26/2017, 04/30/2018   Influenza,inj,Quad PF,6+ Mos 04/10/2013, 04/16/2014, 04/22/2015, 04/25/2016   Influenza-Unspecified 05/01/2021, 04/26/2022   PFIZER(Purple Top)SARS-COV-2 Vaccination 08/11/2019, 08/31/2019, 05/02/2020   Pfizer Covid-19 Vaccine Bivalent Booster 35yrs & up 04/10/2021, 04/26/2022   Pneumococcal Conjugate-13 04/30/2014   Pneumococcal Polysaccharide-23 03/08/2009   Td 03/08/2009   Tdap 05/06/2020   Zoster, Live 04/09/2012    TDAP status: Up to date  Flu Vaccine status: Up to date  Pneumococcal vaccine status: Up to date  Covid-19 vaccine status: Completed vaccines  Qualifies for Shingles Vaccine? Yes   Zostavax completed Yes   Shingrix Completed?: No.    Education has been provided regarding the importance of this vaccine. Patient has been advised to call insurance company to determine out of pocket expense if they have not yet received this vaccine. Advised may also receive vaccine at local pharmacy or Health Dept. Verbalized acceptance and understanding.  Screening Tests Health Maintenance  Topic Date Due   Zoster Vaccines- Shingrix (1 of 2) Never done   COLONOSCOPY (Pts 45-79yrs Insurance coverage will need to be confirmed)  06/27/2020   COVID-19 Vaccine (6 - 2023-24 season)  06/21/2022   INFLUENZA VACCINE  02/21/2023   Medicare Annual Wellness (AWV)  11/26/2023   DTaP/Tdap/Td (3 - Td or Tdap) 05/06/2030   Pneumonia Vaccine 35+ Years old  Completed   HPV VACCINES  Aged Out    Health Maintenance  Health Maintenance Due  Topic Date Due   Zoster Vaccines- Shingrix (1 of 2) Never done   COLONOSCOPY (Pts 45-68yrs Insurance coverage will need to be confirmed)  06/27/2020   COVID-19 Vaccine (6 - 2023-24 season) 06/21/2022    Colorectal cancer screening: Type of screening: Cologuard. Completed 05/24/2021. Repeat every 3 years  Lung Cancer Screening: (Low Dose CT Chest recommended if Age 37-80 years, 30 pack-year currently smoking OR have quit w/in 15years.) does not qualify.   Lung Cancer Screening Referral: no  Additional Screening:  Hepatitis C Screening: does not qualify; Completed: No  Vision Screening: Recommended annual ophthalmology exams for early detection of glaucoma and other disorders of the eye. Is the patient up to date with their annual eye exam?  No  Who is the provider or what is the name of the office in which the patient attends annual eye exams? Patient declined If pt is not established with a  provider, would they like to be referred to a provider to establish care? No .   Dental Screening: Recommended annual dental exams for proper oral hygiene  Community Resource Referral / Chronic Care Management: CRR required this visit?  No   CCM required this visit?  No      Plan:     I have personally reviewed and noted the following in the patient's chart:   Medical and social history Use of alcohol, tobacco or illicit drugs  Current medications and supplements including opioid prescriptions. Patient is not currently taking opioid prescriptions. Functional ability and status Nutritional status Physical activity Advanced directives List of other physicians Hospitalizations, surgeries, and ER visits in previous 12  months Vitals Screenings to include cognitive, depression, and falls Referrals and appointments  In addition, I have reviewed and discussed with patient certain preventive protocols, quality metrics, and best practice recommendations. A written personalized care plan for preventive services as well as general preventive health recommendations were provided to patient.     Mickeal Needy, LPN   07/28/1094   Nurse Notes: Normal cognitive status assessed by direct  observation via phone conversationby this Nurse Health Advisor. No abnormalities found.

## 2022-11-26 NOTE — Patient Instructions (Addendum)
Brian Branch , Thank you for taking time to come for your Medicare Wellness Visit. I appreciate your ongoing commitment to your health goals. Please review the following plan we discussed and let me know if I can assist you in the future.   These are the goals we discussed:  Goals      My goal is to keep waking up every morning.        This is a list of the screening recommended for you and due dates:  Health Maintenance  Topic Date Due   Zoster (Shingles) Vaccine (1 of 2) Never done   Colon Cancer Screening  06/27/2020   COVID-19 Vaccine (6 - 2023-24 season) 06/21/2022   Flu Shot  02/21/2023   Medicare Annual Wellness Visit  11/26/2023   DTaP/Tdap/Td vaccine (3 - Td or Tdap) 05/06/2030   Pneumonia Vaccine  Completed   HPV Vaccine  Aged Out    Advanced directives: Yes  Conditions/risks identified: Yes  Next appointment: Follow up in one year for your annual wellness visit.   Preventive Care 88 Years and Older, Male  Preventive care refers to lifestyle choices and visits with your health care provider that can promote health and wellness. What does preventive care include? A yearly physical exam. This is also called an annual well check. Dental exams once or twice a year. Routine eye exams. Ask your health care provider how often you should have your eyes checked. Personal lifestyle choices, including: Daily care of your teeth and gums. Regular physical activity. Eating a healthy diet. Avoiding tobacco and drug use. Limiting alcohol use. Practicing safe sex. Taking low doses of aspirin every day. Taking vitamin and mineral supplements as recommended by your health care provider. What happens during an annual well check? The services and screenings done by your health care provider during your annual well check will depend on your age, overall health, lifestyle risk factors, and family history of disease. Counseling  Your health care provider may ask you questions about  your: Alcohol use. Tobacco use. Drug use. Emotional well-being. Home and relationship well-being. Sexual activity. Eating habits. History of falls. Memory and ability to understand (cognition). Work and work Astronomer. Screening  You may have the following tests or measurements: Height, weight, and BMI. Blood pressure. Lipid and cholesterol levels. These may be checked every 5 years, or more frequently if you are over 14 years old. Skin check. Lung cancer screening. You may have this screening every year starting at age 22 if you have a 30-pack-year history of smoking and currently smoke or have quit within the past 15 years. Fecal occult blood test (FOBT) of the stool. You may have this test every year starting at age 42. Flexible sigmoidoscopy or colonoscopy. You may have a sigmoidoscopy every 5 years or a colonoscopy every 10 years starting at age 77. Prostate cancer screening. Recommendations will vary depending on your family history and other risks. Hepatitis C blood test. Hepatitis B blood test. Sexually transmitted disease (STD) testing. Diabetes screening. This is done by checking your blood sugar (glucose) after you have not eaten for a while (fasting). You may have this done every 1-3 years. Abdominal aortic aneurysm (AAA) screening. You may need this if you are a current or former smoker. Osteoporosis. You may be screened starting at age 78 if you are at high risk. Talk with your health care provider about your test results, treatment options, and if necessary, the need for more tests. Vaccines  Your health  care provider may recommend certain vaccines, such as: Influenza vaccine. This is recommended every year. Tetanus, diphtheria, and acellular pertussis (Tdap, Td) vaccine. You may need a Td booster every 10 years. Zoster vaccine. You may need this after age 21. Pneumococcal 13-valent conjugate (PCV13) vaccine. One dose is recommended after age 15. Pneumococcal  polysaccharide (PPSV23) vaccine. One dose is recommended after age 66. Talk to your health care provider about which screenings and vaccines you need and how often you need them. This information is not intended to replace advice given to you by your health care provider. Make sure you discuss any questions you have with your health care provider. Document Released: 08/05/2015 Document Revised: 03/28/2016 Document Reviewed: 05/10/2015 Elsevier Interactive Patient Education  2017 [place] Prevention in the Home Falls can cause injuries. They can happen to people of all ages. There are many things you can do to make your home safe and to help prevent falls. What can I do on the outside of my home? Regularly fix the edges of walkways and driveways and fix any cracks. Remove anything that might make you trip as you walk through a door, such as a raised step or threshold. Trim any bushes or trees on the path to your home. Use bright outdoor lighting. Clear any walking paths of anything that might make someone trip, such as rocks or tools. Regularly check to see if handrails are loose or broken. Make sure that both sides of any steps have handrails. Any raised decks and porches should have guardrails on the edges. Have any leaves, snow, or ice cleared regularly. Use sand or salt on walking paths during winter. Clean up any spills in your garage right away. This includes oil or grease spills. What can I do in the bathroom? Use night lights. Install grab bars by the toilet and in the tub and shower. Do not use towel bars as grab bars. Use non-skid mats or decals in the tub or shower. If you need to sit down in the shower, use a plastic, non-slip stool. Keep the floor dry. Clean up any water that spills on the floor as soon as it happens. Remove soap buildup in the tub or shower regularly. Attach bath mats securely with double-sided non-slip rug tape. Do not have throw rugs and other  things on the floor that can make you trip. What can I do in the bedroom? Use night lights. Make sure that you have a light by your bed that is easy to reach. Do not use any sheets or blankets that are too big for your bed. They should not hang down onto the floor. Have a firm chair that has side arms. You can use this for support while you get dressed. Do not have throw rugs and other things on the floor that can make you trip. What can I do in the kitchen? Clean up any spills right away. Avoid walking on wet floors. Keep items that you use a lot in easy-to-reach places. If you need to reach something above you, use a strong step stool that has a grab bar. Keep electrical cords out of the way. Do not use floor polish or wax that makes floors slippery. If you must use wax, use non-skid floor wax. Do not have throw rugs and other things on the floor that can make you trip. What can I do with my stairs? Do not leave any items on the stairs. Make sure that there are handrails on  both sides of the stairs and use them. Fix handrails that are broken or loose. Make sure that handrails are as long as the stairways. Check any carpeting to make sure that it is firmly attached to the stairs. Fix any carpet that is loose or worn. Avoid having throw rugs at the top or bottom of the stairs. If you do have throw rugs, attach them to the floor with carpet tape. Make sure that you have a light switch at the top of the stairs and the bottom of the stairs. If you do not have them, ask someone to add them for you. What else can I do to help prevent falls? Wear shoes that: Do not have high heels. Have rubber bottoms. Are comfortable and fit you well. Are closed at the toe. Do not wear sandals. If you use a stepladder: Make sure that it is fully opened. Do not climb a closed stepladder. Make sure that both sides of the stepladder are locked into place. Ask someone to hold it for you, if possible. Clearly  mark and make sure that you can see: Any grab bars or handrails. First and last steps. Where the edge of each step is. Use tools that help you move around (mobility aids) if they are needed. These include: Canes. Walkers. Scooters. Crutches. Turn on the lights when you go into a dark area. Replace any light bulbs as soon as they burn out. Set up your furniture so you have a clear path. Avoid moving your furniture around. If any of your floors are uneven, fix them. If there are any pets around you, be aware of where they are. Review your medicines with your doctor. Some medicines can make you feel dizzy. This can increase your chance of falling. Ask your doctor what other things that you can do to help prevent falls. This information is not intended to replace advice given to you by your health care provider. Make sure you discuss any questions you have with your health care provider. Document Released: 05/05/2009 Document Revised: 12/15/2015 Document Reviewed: 08/13/2014 Elsevier Interactive Patient Education  2017 [employer].

## 2023-03-10 ENCOUNTER — Encounter: Payer: Self-pay | Admitting: Internal Medicine

## 2023-05-13 ENCOUNTER — Telehealth: Payer: Self-pay | Admitting: *Deleted

## 2023-05-13 ENCOUNTER — Encounter: Payer: Self-pay | Admitting: Internal Medicine

## 2023-05-13 ENCOUNTER — Ambulatory Visit: Payer: Medicare Other | Admitting: Internal Medicine

## 2023-05-13 VITALS — BP 118/64 | HR 62 | Temp 98.2°F | Ht 65.0 in | Wt 155.0 lb

## 2023-05-13 DIAGNOSIS — R011 Cardiac murmur, unspecified: Secondary | ICD-10-CM | POA: Diagnosis not present

## 2023-05-13 DIAGNOSIS — E785 Hyperlipidemia, unspecified: Secondary | ICD-10-CM | POA: Diagnosis not present

## 2023-05-13 DIAGNOSIS — E559 Vitamin D deficiency, unspecified: Secondary | ICD-10-CM | POA: Diagnosis not present

## 2023-05-13 DIAGNOSIS — E538 Deficiency of other specified B group vitamins: Secondary | ICD-10-CM | POA: Diagnosis not present

## 2023-05-13 DIAGNOSIS — Z0001 Encounter for general adult medical examination with abnormal findings: Secondary | ICD-10-CM

## 2023-05-13 DIAGNOSIS — R739 Hyperglycemia, unspecified: Secondary | ICD-10-CM

## 2023-05-13 LAB — CBC WITH DIFFERENTIAL/PLATELET
Basophils Absolute: 0.1 10*3/uL (ref 0.0–0.1)
Basophils Relative: 0.8 % (ref 0.0–3.0)
Eosinophils Absolute: 0.5 10*3/uL (ref 0.0–0.7)
Eosinophils Relative: 6.1 % — ABNORMAL HIGH (ref 0.0–5.0)
HCT: 43.5 % (ref 39.0–52.0)
Hemoglobin: 14.2 g/dL (ref 13.0–17.0)
Lymphocytes Relative: 17.7 % (ref 12.0–46.0)
Lymphs Abs: 1.5 10*3/uL (ref 0.7–4.0)
MCHC: 32.7 g/dL (ref 30.0–36.0)
MCV: 95.4 fL (ref 78.0–100.0)
Monocytes Absolute: 0.6 10*3/uL (ref 0.1–1.0)
Monocytes Relative: 7.1 % (ref 3.0–12.0)
Neutro Abs: 5.8 10*3/uL (ref 1.4–7.7)
Neutrophils Relative %: 68.3 % (ref 43.0–77.0)
Platelets: 215 10*3/uL (ref 150.0–400.0)
RBC: 4.56 Mil/uL (ref 4.22–5.81)
RDW: 13.1 % (ref 11.5–15.5)
WBC: 8.5 10*3/uL (ref 4.0–10.5)

## 2023-05-13 LAB — URINALYSIS, ROUTINE W REFLEX MICROSCOPIC
Bilirubin Urine: NEGATIVE
Hgb urine dipstick: NEGATIVE
Ketones, ur: NEGATIVE
Leukocytes,Ua: NEGATIVE
Nitrite: NEGATIVE
RBC / HPF: NONE SEEN (ref 0–?)
Specific Gravity, Urine: 1.01 (ref 1.000–1.030)
Total Protein, Urine: NEGATIVE
Urine Glucose: NEGATIVE
Urobilinogen, UA: 0.2 (ref 0.0–1.0)
WBC, UA: NONE SEEN (ref 0–?)
pH: 6 (ref 5.0–8.0)

## 2023-05-13 LAB — BASIC METABOLIC PANEL
BUN: 18 mg/dL (ref 6–23)
CO2: 29 meq/L (ref 19–32)
Calcium: 9.8 mg/dL (ref 8.4–10.5)
Chloride: 103 meq/L (ref 96–112)
Creatinine, Ser: 1.06 mg/dL (ref 0.40–1.50)
GFR: 65.93 mL/min (ref >60.00)
Glucose, Bld: 122 mg/dL — ABNORMAL HIGH (ref 70–99)
Potassium: 4.7 meq/L (ref 3.5–5.1)
Sodium: 139 meq/L (ref 135–145)

## 2023-05-13 LAB — LIPID PANEL
Cholesterol: 109 mg/dL (ref 0–200)
HDL: 34.8 mg/dL — ABNORMAL LOW (ref >39.00)
LDL Cholesterol: 38 mg/dL (ref 0–99)
NonHDL: 73.84
Total CHOL/HDL Ratio: 3
Triglycerides: 180 mg/dL — ABNORMAL HIGH (ref 0.0–149.0)
VLDL: 36 mg/dL (ref 0.0–40.0)

## 2023-05-13 LAB — HEPATIC FUNCTION PANEL
ALT: 15 U/L (ref 0–53)
AST: 21 U/L (ref 0–37)
Albumin: 4.4 g/dL (ref 3.5–5.2)
Alkaline Phosphatase: 45 U/L (ref 39–117)
Bilirubin, Direct: 0.1 mg/dL (ref 0.0–0.3)
Total Bilirubin: 0.7 mg/dL (ref 0.2–1.2)
Total Protein: 7.2 g/dL (ref 6.0–8.3)

## 2023-05-13 LAB — HEMOGLOBIN A1C: Hgb A1c MFr Bld: 6.1 % (ref 4.6–6.5)

## 2023-05-13 LAB — TSH: TSH: 1.33 u[IU]/mL (ref 0.35–5.50)

## 2023-05-13 LAB — VITAMIN D 25 HYDROXY (VIT D DEFICIENCY, FRACTURES): VITD: 112.76 ng/mL (ref 30.00–100.00)

## 2023-05-13 LAB — VITAMIN B12: Vitamin B-12: >1537 pg/mL — ABNORMAL HIGH (ref 211–911)

## 2023-05-13 MED ORDER — TAMSULOSIN HCL 0.4 MG PO CAPS: 0.4000 mg | ORAL_CAPSULE | Freq: Every day | ORAL | 3 refills | Status: DC

## 2023-05-13 MED ORDER — OMEPRAZOLE 40 MG PO CPDR: 40.0000 mg | DELAYED_RELEASE_CAPSULE | Freq: Two times a day (BID) | ORAL | 3 refills | Status: DC

## 2023-05-13 MED ORDER — SOLIFENACIN SUCCINATE 5 MG PO TABS: 5.0000 mg | ORAL_TABLET | Freq: Every day | ORAL | 3 refills | Status: DC

## 2023-05-13 MED ORDER — ASPIRIN 81 MG PO TBEC: DELAYED_RELEASE_TABLET | ORAL | 3 refills | Status: AC

## 2023-05-13 MED ORDER — ROSUVASTATIN CALCIUM 20 MG PO TABS: 20.0000 mg | ORAL_TABLET | Freq: Every day | ORAL | 3 refills | Status: DC

## 2023-05-13 NOTE — Progress Notes (Unsigned)
Patient ID: Brian Branch, male   DOB: April 30, 1941, 82 y.o.   MRN: [id number]         Chief Complaint:: wellness exam and Annual Exam (Concerns about hallucinations sometimes , also having pain in left hip)  , heart murmur       HPI:  Brian Branch is a 82 y.o. male here for wellness exam; for shingrx at pharmacy, o/w up to date                        Also has visual hallucination about once per wk for several months, Denies worsening depressive symptoms, suicidal ideation, or panic; but has chronic mild low mood as well.  Declines change in tx due to symptoms infrequent.   Pt denies chest pain, increased sob or doe, wheezing, orthopnea, PND, increased LE swelling, palpitations, dizziness or syncope.   Pt denies polydipsia, polyuria, or new focal neuro s/s.    Pt denies fever, wt loss, night sweats, loss of appetite, or other constitutional symptoms     Wt Readings from Last 3 Encounters:  05/13/23 155 lb (70.3 kg)  11/26/22 160 lb (72.6 kg)  05/10/22 150 lb (68 kg)   BP Readings from Last 3 Encounters:  05/13/23 118/64  05/10/22 126/72  12/12/21 114/66   Immunization History  Administered Date(s) Administered   Fluad Quad(high Dose 65+) 05/06/2019, 05/06/2020, 04/12/2023   Influenza Split 04/13/2011, 04/09/2012   Influenza Whole 04/12/2010   Influenza, High Dose Seasonal PF 04/26/2017, 04/30/2018   Influenza,inj,Quad PF,6+ Mos 04/10/2013, 04/16/2014, 04/22/2015, 04/25/2016   Influenza-Unspecified 05/01/2021, 04/26/2022   PFIZER(Purple Top)SARS-COV-2 Vaccination 08/11/2019, 08/31/2019, 05/02/2020   Pfizer Covid-19 Vaccine Bivalent Booster 81yrs & up 04/10/2021, 04/26/2022, 04/12/2023   Pneumococcal Conjugate-13 04/30/2014   Pneumococcal Polysaccharide-23 03/08/2009   Td 03/08/2009   Tdap 05/06/2020   Zoster, Live 04/09/2012   Health Maintenance Due  Topic Date Due   Zoster Vaccines- Shingrix (1 of 2) 02/03/1992      Past Medical History:  Diagnosis Date   ALLERGIC  RHINITIS 02/15/2007   Allergy    BENIGN PROSTATIC HYPERTROPHY 02/15/2007   CAD (coronary artery disease)    COLONIC POLYPS, HX OF 08/29/2007   CORONARY ARTERY DISEASE 02/15/2007   DEGENERATIVE JOINT DISEASE, 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   DEPRESSION 08/29/2007   pt denies having depression   ED (erectile dysfunction)    FATIGUE 08/29/2007   GERD (gastroesophageal reflux disease)    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   HYPERLIPIDEMIA 02/15/2007   INSOMNIA-SLEEP DISORDER-UNSPEC 03/09/2010   KNEE PAIN, LEFT 08/29/2007   MYOCARDIAL INFARCTION, 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   SHINGLES, 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   TOBACCO ABUSE 03/09/2010   Unspecified eustachian tube disorder 03/09/2010   Past Surgical History:  Procedure Laterality Date   APPENDECTOMY  1951   CARDIAC CATHETERIZATION     1991- [place]   COLONOSCOPY     CORONARY ARTERY BYPASS GRAFT  1991   MICROLARYNGOSCOPY N/A 06/01/2020   Procedure: SUSPENDED MICRO DIRECT LARYNGOSCOPY WITH PROLARYN INJECTIONS & JET VENTILATION;  Surgeon: Christia Reading, MD;  Location: [facility] OR;  Service: ENT;  Laterality: N/A;   POLYPECTOMY     TONSILLECTOMY AND ADENOIDECTOMY     VASECTOMY  1978    reports that he quit smoking about 14 years ago. His smoking use included cigarettes. He has never used smokeless tobacco. He reports current alcohol use of about 2.0 standard drinks of alcohol per week. He reports  that he does not use drugs. family history includes Arthritis in his mother; Heart disease in his father. Allergies  Allergen Reactions   Levitra [Vardenafil Hydrochloride]     headache   Lincomycin Hives   Penicillins Hives    REACTION: 40 years ago   Tadalafil     REACTION: headache   Triazolam     REACTION: hallucinations   Vardenafil Nausea And Vomiting    headache   Current Outpatient Medications on File Prior to Visit  Medication Sig Dispense Refill   acetaminophen (TYLENOL) 500 MG tablet Take 1,000 mg by mouth every 6 (six) hours as needed for mild pain or  moderate pain. Rapid release     Cholecalciferol 50 MCG (2000 UT) TABS 1 tab by mouth once daily 90 tablet 3   Cyanocobalamin (VITAMIN B-12) 5000 MCG SUBL Place 5,000 mcg under the tongue daily.     diphenhydrAMINE (BENADRYL) 25 mg capsule Take 25 mg by mouth in the morning and at bedtime. Allergies     No current facility-administered medications on file prior to visit.        ROS:  All others reviewed and negative.  Objective        PE:  BP 118/64 (BP Location: Right Arm, Patient Position: Sitting, Cuff Size: Normal)   Pulse 62   Temp 98.2 F (36.8 C) (Oral)   Ht 5\' 5"  (1.651 m)   Wt 155 lb (70.3 kg)   SpO2 98%   BMI 25.79 kg/m                 Constitutional: Pt appears in NAD               HENT: Head: NCAT.                Right Ear: External ear normal.                 Left Ear: External ear normal.                Eyes: . Pupils are equal, round, and reactive to light. Conjunctivae and EOM are normal               Nose: without d/c or deformity               Neck: Neck supple. Gross normal ROM               Cardiovascular: Normal rate and regular rhythm.  Gr 2/6 sys murmur RUSB               Pulmonary/Chest: Effort normal and breath sounds without rales or wheezing.                Abd:  Soft, NT, ND, + BS, no organomegaly               Neurological: Pt is alert. At baseline orientation, motor grossly intact               Skin: Skin is warm. No rashes, no other new lesions, LE edema - none               Psychiatric: Pt behavior is normal without agitation   Micro: none  Cardiac tracings I have personally interpreted today:  none  Pertinent Radiological findings (summarize): none   Lab Results  Component Value Date   WBC 8.5 05/13/2023   HGB 14.2 05/13/2023   HCT 43.5 05/13/2023   PLT 215.0 05/13/2023  GLUCOSE 122 (H) 05/13/2023   CHOL 109 05/13/2023   TRIG 180.0 (H) 05/13/2023   HDL 34.80 (L) 05/13/2023   LDLDIRECT 72.0 04/30/2018   LDLCALC 38 05/13/2023   ALT  15 05/13/2023   AST 21 05/13/2023   NA 139 05/13/2023   K 4.7 05/13/2023   CL 103 05/13/2023   CREATININE 1.06 05/13/2023   BUN 18 05/13/2023   CO2 29 05/13/2023   TSH 1.33 05/13/2023   PSA 1.73 05/09/2021   HGBA1C 6.1 05/13/2023   MICROALBUR 1.0 05/10/2022   Assessment/Plan:  CLEMENTS OHAYON is a 82 y.o. White or Caucasian [1] male with  has a past medical history of ALLERGIC RHINITIS (02/15/2007), Allergy, BENIGN PROSTATIC HYPERTROPHY (02/15/2007), CAD (coronary artery disease), COLONIC POLYPS, HX OF (08/29/2007), CORONARY ARTERY DISEASE (02/15/2007), DEGENERATIVE JOINT DISEASE, CERVICAL SPINE (02/15/2007), DEPRESSION (08/29/2007), ED (erectile dysfunction), FATIGUE (08/29/2007), GERD (gastroesophageal reflux disease), Heart attack (HCC), HYPERLIPIDEMIA (02/15/2007), INSOMNIA-SLEEP DISORDER-UNSPEC (03/09/2010), KNEE PAIN, LEFT (08/29/2007), MYOCARDIAL INFARCTION, HX OF (02/15/2007), SHINGLES, HX OF (02/15/2007), TOBACCO ABUSE (03/09/2010), and Unspecified eustachian tube disorder (03/09/2010).  Encounter for well adult exam with abnormal findings Age and sex appropriate education and counseling updated with regular exercise and diet Referrals for preventative services - none needed Immunizations addressed - for shingrix at pharmacy Smoking counseling  - none needed Evidence for depression or other mood disorder - chronic mild depression stable Most recent labs reviewed. I have personally reviewed and have noted: 1) the patient's medical and social history 2) The patient's current medications and supplements 3) The patient's height, weight, and BMI have been recorded in the chart   Vitamin D deficiency Last vitamin D Lab Results  Component Value Date   VD25OH 112.76 (HH) 05/13/2023   Stable, cont oral replacement  Hyperlipidemia Lab Results  Component Value Date   LDLCALC 38 05/13/2023   Stable, pt to continue current statin crestor 20 qd   Hyperglycemia Lab Results  Component Value  Date   HGBA1C 6.1 05/13/2023   Stable, pt to continue current medical treatment  - diet, wt control   B12 deficiency Lab Results  Component Value Date   VITAMINB12 >1537 (H) 05/13/2023   Stable, cont oral replacement - b12 1000 mcg qd   Murmur, cardiac ? Worsening, asympt, declines echo for now  Followup: Return in about 1 year (around 05/12/2024).  Oliver Barre, MD 05/16/2023 8:17 PM [facility] Internal Medicine

## 2023-05-13 NOTE — Telephone Encounter (Signed)
CRITICAL VALUE STICKER  CRITICAL VALUE:  Vitamin D 112.76  RECEIVER (on-site recipient of call): Wylene Simmer  DATE & TIME NOTIFIED: 05/13/2023 @ 2:09pm  MESSENGER (representative from lab):[facility]  MD NOTIFIED: yes  TIME OF NOTIFICATION:2:09pm  RESPONSE:  awaiting response

## 2023-05-13 NOTE — Telephone Encounter (Signed)
Noted, ok to follow for now

## 2023-05-13 NOTE — Patient Instructions (Signed)
Please continue all other medications as before, and refills have been done if requested.  Please have the pharmacy call with any other refills you may need.  Please continue your efforts at being more active, low cholesterol diet, and weight control.  You are otherwise up to date with prevention measures today.  Please keep your appointments with your specialists as you may have planned  Please let us know if you would want to repeat the Echo for the heart murmur possibly worsening  Please go to the LAB at the blood drawing area for the tests to be done  You will be contacted by phone if any changes need to be made immediately.  Otherwise, you will receive a letter about your results with an explanation, but please check with MyChart first.  Please make an Appointment to return for your 1 year visit, or sooner if needed

## 2023-05-15 ENCOUNTER — Encounter: Payer: Self-pay | Admitting: Internal Medicine

## 2023-05-15 ENCOUNTER — Other Ambulatory Visit: Payer: Self-pay

## 2023-05-15 MED ORDER — FLUTICASONE PROPIONATE 50 MCG/ACT NA SUSP: 2.0000 | Freq: Two times a day (BID) | NASAL | 3 refills | Status: DC

## 2023-05-16 ENCOUNTER — Encounter: Payer: Self-pay | Admitting: Internal Medicine

## 2023-05-16 DIAGNOSIS — R011 Cardiac murmur, unspecified: Secondary | ICD-10-CM | POA: Insufficient documentation

## 2023-05-16 NOTE — Assessment & Plan Note (Signed)
Age and sex appropriate education and counseling updated with regular exercise and diet Referrals for preventative services - none needed Immunizations addressed - for shingrix at pharmacy Smoking counseling  - none needed Evidence for depression or other mood disorder - chronic mild depression stable Most recent labs reviewed. I have personally reviewed and have noted: 1) the patient's medical and social history 2) The patient's current medications and supplements 3) The patient's height, weight, and BMI have been recorded in the chart

## 2023-05-16 NOTE — Assessment & Plan Note (Signed)
Last vitamin D Lab Results  Component Value Date   VD25OH 112.76 (HH) 05/13/2023   Stable, cont oral replacement

## 2023-05-16 NOTE — Assessment & Plan Note (Signed)
Lab Results  Component Value Date   VITAMINB12 >1537 (H) 05/13/2023   Stable, cont oral replacement - b12 1000 mcg qd

## 2023-05-16 NOTE — Assessment & Plan Note (Signed)
Lab Results  Component Value Date   HGBA1C 6.1 05/13/2023   Stable, pt to continue current medical treatment  - diet, wt control

## 2023-05-16 NOTE — Assessment & Plan Note (Signed)
?   Worsening, asympt, declines echo for now

## 2023-05-16 NOTE — Assessment & Plan Note (Signed)
Lab Results  Component Value Date   LDLCALC 38 05/13/2023   Stable, pt to continue current statin crestor 20 qd

## 2023-05-23 ENCOUNTER — Other Ambulatory Visit: Payer: Self-pay | Admitting: Radiology

## 2023-05-23 MED ORDER — FLUTICASONE PROPIONATE 50 MCG/ACT NA SUSP: 2.0000 | Freq: Two times a day (BID) | NASAL | 3 refills | Status: DC

## 2023-07-01 ENCOUNTER — Encounter: Payer: Self-pay | Admitting: Internal Medicine

## 2023-07-02 ENCOUNTER — Encounter: Payer: Self-pay | Admitting: Internal Medicine

## 2023-07-02 DIAGNOSIS — I340 Nonrheumatic mitral (valve) insufficiency: Secondary | ICD-10-CM

## 2023-07-02 DIAGNOSIS — R011 Cardiac murmur, unspecified: Secondary | ICD-10-CM

## 2023-07-26 ENCOUNTER — Encounter: Payer: Self-pay | Admitting: Internal Medicine

## 2023-08-09 ENCOUNTER — Ambulatory Visit (HOSPITAL_COMMUNITY): Payer: Medicare Other | Attending: Cardiology

## 2023-08-09 DIAGNOSIS — R011 Cardiac murmur, unspecified: Secondary | ICD-10-CM | POA: Diagnosis present

## 2023-08-09 DIAGNOSIS — I340 Nonrheumatic mitral (valve) insufficiency: Secondary | ICD-10-CM

## 2023-08-09 LAB — ECHOCARDIOGRAM COMPLETE
AR max vel: 1.47 cm2
AV Area VTI: 1.54 cm2
AV Area mean vel: 1.45 cm2
AV Mean grad: 8 mm[Hg]
AV Peak grad: 14.4 mm[Hg]
AV Vena cont: 0.2 cm
Ao pk vel: 1.9 m/s
Area-P 1/2: 3.12 cm2
Calc EF: 51.9 %
P 1/2 time: 331 ms
Radius: 0.3 cm
S' Lateral: 3.3 cm
Single Plane A2C EF: 49.7 %
Single Plane A4C EF: 52.7 %

## 2023-08-10 ENCOUNTER — Encounter: Payer: Self-pay | Admitting: Internal Medicine

## 2023-09-26 NOTE — Progress Notes (Signed)
 [facility] [address] [place] [address] Phone: [phone] Subjective:   Bruce Donath, am serving as a scribe for Dr. Antoine Primas.  I'm seeing this patient by the request  of:  Corwin Levins, MD  CC: Right shoulder pain  [id number]  12/12/2021 Injection given today and tolerated the procedure well, discussed icing regimen and home exercise, discussed which activities to do and which ones to avoid. 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 Follow-up again in 6 to 8 weeks   More of an acute onset of the left shoulder pain.  Seems to be worsening at this time.  Discussed with patient icing regimen.  Patient is having difficulty with daily activities as well as sleep.  Patient does not feel like it is improving with the conservative therapy.  Tried including home exercises, medications, and icing regimen.  We will get MRI to further evaluate the rotator cuff to give patient a possibility for surgical intervention if necessary.  Otherwise we will consider injections Finally this and more of a palliative treatment for this condition.  Follow-up after MRI     Updated 10/01/2023 ZACKRY DEINES is a 83 y.o. male coming in with complaint of R shoulder pain for the past month. Raised arm overhead and developed excruciating pain in middle deltoid. Pain improving but he still has to be very careful. Using ice and heat. Also tried KT tape.        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   DEPRESSION 08/29/2007   pt denies having depression   ED (erectile dysfunction)    FATIGUE 08/29/2007   GERD (gastroesophageal reflux disease)    Heart  attack (HCC)    1985 & 1991   HYPERLIPIDEMIA 02/15/2007   INSOMNIA-SLEEP DISORDER-UNSPEC 03/09/2010   KNEE PAIN, LEFT 08/29/2007   MYOCARDIAL INFARCTION, 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   SHINGLES, 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   TOBACCO ABUSE 03/09/2010   Unspecified eustachian tube disorder 03/09/2010   Past Surgical History:  Procedure Laterality Date   APPENDECTOMY  1951   CARDIAC CATHETERIZATION     1991- [place]   COLONOSCOPY     CORONARY ARTERY BYPASS GRAFT  1991   MICROLARYNGOSCOPY N/A 06/01/2020   Procedure: SUSPENDED MICRO DIRECT LARYNGOSCOPY WITH PROLARYN INJECTIONS & JET VENTILATION;  Surgeon: Christia Reading, MD;  Location: [facility] OR;  Service: ENT;  Laterality: N/A;   POLYPECTOMY     TONSILLECTOMY AND ADENOIDECTOMY     VASECTOMY  1978   Social History   Socioeconomic History   Marital status: Married    Spouse name: Not on file   Number of children: 1   Years of education: Not on file   Highest education level: Not on file  Occupational History   Not on file  Tobacco Use   Smoking status: Former    Current packs/day: 0.00    Types: Cigarettes    Quit date: 07/23/2008    Years since quitting: 15.1   Smokeless tobacco: Never  Vaping Use   Vaping status: Never Used  Substance and Sexual Activity   Alcohol use: Yes    Alcohol/week: 2.0 standard drinks of  alcohol    Types: 2 Cans of beer per week    Comment: occassionally I will drink about 2 beers   Drug use: No   Sexual activity: Not Currently  Other Topics Concern   Not on file  Social History Narrative   Not on file   Social Drivers of Health   Financial Resource Strain: Low Risk  (11/26/2022)   Overall Financial Resource Strain (CARDIA)    Difficulty of Paying Living Expenses: Not hard at all  Food Insecurity: No Food Insecurity (10/30/2021)   Hunger Vital Sign    Worried About Running Out of Food in the Last Year: Never true    Ran Out of Food in the Last Year: Never true  Transportation Needs: No Transportation Needs (11/26/2022)    PRAPARE - Administrator, Civil Service (Medical): No    Lack of Transportation (Non-Medical): No  Physical Activity: Inactive (11/26/2022)   Exercise Vital Sign    Days of Exercise per Week: 0 days    Minutes of Exercise per Session: 0 min  Stress: No Stress Concern Present (11/26/2022)   [employer] of Occupational Health - Occupational Stress Questionnaire    Feeling of Stress : Not at all  Social Connections: Moderately Isolated (11/26/2022)   Social Connection and Isolation Panel [NHANES]    Frequency of Communication with Friends and Family: Twice a week    Frequency of Social Gatherings with Friends and Family: Twice a week    Attends Religious Services: Never    Database administrator or Organizations: No    Attends Engineer, structural: Never    Marital Status: Married   Allergies  Allergen Reactions   Levitra [Vardenafil Hydrochloride]     headache   Lincomycin Hives   Penicillins Hives    REACTION: 40 years ago   Tadalafil     REACTION: headache   Triazolam     REACTION: hallucinations   Vardenafil Nausea And Vomiting    headache   Family History  Problem Relation Age of Onset   Arthritis Mother        Rhuematoid   Heart disease Father    Colon cancer Neg Hx    Esophageal cancer Neg Hx    Rectal cancer Neg Hx    Stomach cancer Neg Hx    Pancreatic cancer Neg Hx    Prostate cancer Neg Hx    Colon polyps Neg Hx      Current Outpatient Medications (Cardiovascular):    rosuvastatin (CRESTOR) 20 MG tablet, Take 1 tablet (20 mg total) by mouth at bedtime.  Current Outpatient Medications (Respiratory):    diphenhydrAMINE (BENADRYL) 25 mg capsule, Take 25 mg by mouth in the morning and at bedtime. Allergies   fluticasone (FLONASE) 50 MCG/ACT nasal spray, Place 2 sprays into both nostrils in the morning and at bedtime.  Current Outpatient Medications (Analgesics):    acetaminophen (TYLENOL) 500 MG tablet, Take 1,000 mg by mouth every 6  (six) hours as needed for mild pain or moderate pain. Rapid release   aspirin EC (ASPIRIN LOW DOSE) 81 MG tablet, TAKE 1 TABLET DAILY (SWALLOW WHOLE)  Current Outpatient Medications (Hematological):    Cyanocobalamin (VITAMIN B-12) 5000 MCG SUBL, Place 5,000 mcg under the tongue daily.  Current Outpatient Medications (Other):    Cholecalciferol 50 MCG (2000 UT) TABS, 1 tab by mouth once daily   omeprazole (PRILOSEC) 40 MG capsule, Take 1 capsule (40 mg total) by mouth in the morning  and at bedtime.   solifenacin (VESICARE) 5 MG tablet, Take 1 tablet (5 mg total) by mouth daily.   tamsulosin (FLOMAX) 0.4 MG CAPS capsule, Take 1 capsule (0.4 mg total) by mouth daily.   Reviewed prior external information including notes and imaging from  primary care provider As well as notes that were available from care everywhere and other healthcare systems.  Past medical history, social, surgical and family history all reviewed in electronic medical record.  No pertanent information unless stated regarding to the chief complaint.   Review of Systems:  No headache, visual changes, nausea, vomiting, diarrhea, constipation, dizziness, abdominal pain, skin rash, fevers, chills, night sweats, weight loss, swollen lymph nodes, body aches, joint swelling, chest pain, shortness of breath, mood changes. POSITIVE muscle aches  Objective  Blood pressure 114/78, pulse 68, height 5\' 5"  (1.651 m), weight 153 lb (69.4 kg), SpO2 98%.   General: No apparent distress alert and oriented x3 mood and affect normal, dressed appropriately.  HEENT: Pupils equal, extraocular movements intact  Respiratory: Patient's speak in full sentences and does not appear short of breath  Cardiovascular: No lower extremity edema, non tender, no erythema  Right shoulder does have some weakness noted.  Positive empty can sign.  The patient does have severe weakness of the infraspinatus with 0 normal strength noted.  No atrophy of the  musculature noted but high riding humeral head noted.   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  No cortical irregularities noted that appear to be acute on chronic of the humeral head and mostly lateral.  Procedure: Real-time Ultrasound Guided Injection of right acromioclavicular joint Device: GE Logiq Q7 Ultrasound guided injection is preferred based studies that show increased duration, increased effect, greater accuracy, decreased procedural pain, increased response rate, and decreased cost with ultrasound guided versus blind injection.  Verbal informed consent obtained.  Time-out conducted.  Noted no overlying erythema, induration, or other signs of local infection.  Skin prepped in a sterile fashion.  Local anesthesia: Topical Ethyl chloride.  With sterile technique and under real time ultrasound guidance: With a 25-gauge half inch needle injected with 0.5 cc of 0.5% Marcaine and 0.5 cc of Kenalog 40 mg/mL Completed without difficulty  Pain immediately resolved suggesting accurate placement of the medication.  Advised to call if fevers/chills, erythema, induration, drainage, or persistent bleeding.  Impression: Technically successful ultrasound guided injection.  Procedure: Real-time Ultrasound Guided Injection of right glenohumeral joint Device: GE Logiq Q7  Ultrasound guided injection is preferred based studies that show increased duration, increased effect, greater accuracy, decreased procedural pain, increased response rate with ultrasound guided versus blind injection.  Verbal informed consent obtained.  Time-out conducted.  Noted no overlying erythema, induration, or other signs of local infection.  Skin prepped in a  sterile fashion.  Local anesthesia: Topical Ethyl chloride.  With sterile technique and under real time ultrasound guidance:  Joint visualized.  23g 1  inch needle inserted posterior approach. Pictures taken for needle placement. Patient did have injection of 2 cc of 1% lidocaine, 2 cc of 0.5% Marcaine, and 1.0 cc of Kenalog 40 mg/dL. Completed without difficulty  Pain immediately resolved suggesting accurate placement of the medication.  Advised to call if fevers/chills, erythema, induration, drainage, or persistent bleeding.  Impression: Technically  successful ultrasound guided injection.   Impression and Recommendations:    The above documentation has been reviewed and is accurate and complete Judi Saa, DO

## 2023-09-30 ENCOUNTER — Ambulatory Visit: Admitting: Family Medicine

## 2023-09-30 ENCOUNTER — Other Ambulatory Visit: Payer: Self-pay

## 2023-09-30 ENCOUNTER — Encounter: Payer: Self-pay | Admitting: Family Medicine

## 2023-09-30 ENCOUNTER — Ambulatory Visit (INDEPENDENT_AMBULATORY_CARE_PROVIDER_SITE_OTHER)

## 2023-09-30 VITALS — BP 114/78 | HR 68 | Ht 65.0 in | Wt 153.0 lb

## 2023-09-30 DIAGNOSIS — M25511 Pain in right shoulder: Secondary | ICD-10-CM

## 2023-09-30 DIAGNOSIS — M19011 Primary osteoarthritis, right shoulder: Secondary | ICD-10-CM

## 2023-09-30 DIAGNOSIS — M12811 Other specific arthropathies, not elsewhere classified, right shoulder: Secondary | ICD-10-CM | POA: Insufficient documentation

## 2023-09-30 DIAGNOSIS — M75101 Unspecified rotator cuff tear or rupture of right shoulder, not specified as traumatic: Secondary | ICD-10-CM | POA: Diagnosis not present

## 2023-09-30 DIAGNOSIS — M25512 Pain in left shoulder: Secondary | ICD-10-CM

## 2023-09-30 DIAGNOSIS — M19019 Primary osteoarthritis, unspecified shoulder: Secondary | ICD-10-CM | POA: Insufficient documentation

## 2023-09-30 NOTE — Patient Instructions (Signed)
 Injected shoulder in 2 spots Exercises See me in 10-12 weeks

## 2023-09-30 NOTE — Assessment & Plan Note (Signed)
 Patient given injection today and tolerated the procedure well, discussed icing regimen and home exercises, which activities to do and which ones to avoid.  Increase activity slowly.  Discussed icing regimen.  Patient knows that there is some arthritic changes and would want to avoid any surgical intervention if possible.  Hopeful patient responds well to the injections.  Discussed keeping hands within peripheral vision.  Follow-up again in 6 to 8 weeks

## 2023-09-30 NOTE — Assessment & Plan Note (Signed)
 Patient does have x-ray showing that there was a potential humeral head fracture previously.  We discussed the potential of advanced imaging.  The patient states that that he would not want to do any significant changes in conservative therapy at this time.  Patient would not want any surgical intervention.  Denies any fevers or chills or any abnormal weight loss.  So we will see how patient responds to the injection.  Follow-up again in 10 to 12 weeks.

## 2023-10-15 ENCOUNTER — Ambulatory Visit: Payer: Medicare Other | Admitting: Family Medicine

## 2023-10-16 ENCOUNTER — Encounter: Payer: Self-pay | Admitting: Family Medicine

## 2023-11-27 ENCOUNTER — Ambulatory Visit

## 2023-11-27 ENCOUNTER — Encounter

## 2023-11-27 VITALS — Ht 65.0 in | Wt 153.0 lb

## 2023-11-27 DIAGNOSIS — Z0000 Encounter for general adult medical examination without abnormal findings: Secondary | ICD-10-CM | POA: Diagnosis not present

## 2023-11-27 NOTE — Progress Notes (Signed)
 Subjective:   Brian Branch is a 83 y.o. who presents for a Medicare Wellness preventive visit.  Visit Complete: Virtual I connected with  Margo Shells on 11/27/23 by a audio enabled telemedicine application and verified that I am speaking with the correct person using two identifiers.  Patient Location: Home  Provider Location: Home Office  I discussed the limitations of evaluation and management by telemedicine. The patient expressed understanding and agreed to proceed.  Vital Signs: Because this visit was a virtual/telehealth visit, some criteria may be missing or patient reported. Any vitals not documented were not able to be obtained and vitals that have been documented are patient reported.  VideoDeclined- This patient declined Librarian, academic. Therefore the visit was completed with audio only.  Persons Participating in Visit: Patient.  AWV Questionnaire: No: Patient Medicare AWV questionnaire was not completed prior to this visit.  Cardiac Risk Factors include: advanced age (>74men, >46 women);male gender;Other (see comment);dyslipidemia, Risk factor comments: BPH,  Coronary atherosclerosis     Objective:    Today's Vitals   11/27/23 1319  Weight: 153 lb (69.4 kg)  Height: 5\' 5"  (1.651 m)   Body mass index is 25.46 kg/m.     11/27/2023    1:29 PM 11/26/2022    9:18 AM 11/22/2021    8:54 AM 10/30/2021   10:22 AM 05/26/2020   10:29 AM 11/11/2019    2:57 PM 11/06/2018    2:36 PM  Advanced Directives  Does Patient Have a Medical Advance Directive? Yes Yes Yes Yes Yes Yes Yes  Type of Estate agent of [place];Living will Healthcare Power of [place];Living will Healthcare Power of [place];Living will Healthcare Power of [place];Living will Healthcare Power of [place];Living will Healthcare Power of [place];Living will Healthcare Power of [place];Living will  Does patient want to make changes to medical advance  directive?   No - Patient declined   No - Patient declined   Copy of Healthcare Power of Attorney in Chart? No - copy requested No - copy requested No - copy requested No - copy requested No - copy requested No - copy requested No - copy requested    Current Medications (verified) Outpatient Encounter Medications as of 11/27/2023  Medication Sig   acetaminophen  (TYLENOL ) 500 MG tablet Take 1,000 mg by mouth every 6 (six) hours as needed for mild pain or moderate pain. Rapid release   aspirin  EC (ASPIRIN  LOW DOSE) 81 MG tablet TAKE 1 TABLET DAILY (SWALLOW WHOLE)   Cholecalciferol  50 MCG (2000 UT) TABS 1 tab by mouth once daily   Cyanocobalamin  (VITAMIN B-12) 5000 MCG SUBL Place 5,000 mcg under the tongue daily.   diphenhydrAMINE (BENADRYL) 25 mg capsule Take 25 mg by mouth in the morning and at bedtime. Allergies   fluticasone  (FLONASE ) 50 MCG/ACT nasal spray Place 2 sprays into both nostrils in the morning and at bedtime.   omeprazole  (PRILOSEC) 40 MG capsule Take 1 capsule (40 mg total) by mouth in the morning and at bedtime.   rosuvastatin  (CRESTOR ) 20 MG tablet Take 1 tablet (20 mg total) by mouth at bedtime.   solifenacin  (VESICARE ) 5 MG tablet Take 1 tablet (5 mg total) by mouth daily.   tamsulosin  (FLOMAX ) 0.4 MG CAPS capsule Take 1 capsule (0.4 mg total) by mouth daily.   No facility-administered encounter medications on file as of 11/27/2023.    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   DEPRESSION 08/29/2007   pt denies having depression   ED (erectile dysfunction)    FATIGUE 08/29/2007   GERD (gastroesophageal reflux  disease)    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   HYPERLIPIDEMIA 02/15/2007   INSOMNIA-SLEEP DISORDER-UNSPEC 03/09/2010   KNEE PAIN, LEFT 08/29/2007   MYOCARDIAL INFARCTION, 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   SHINGLES, 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   TOBACCO ABUSE 03/09/2010   Unspecified eustachian tube disorder 03/09/2010   Past Surgical History:  Procedure Laterality Date   APPENDECTOMY  1951   CARDIAC CATHETERIZATION     1991- [place]   COLONOSCOPY     CORONARY ARTERY BYPASS GRAFT  1991   MICROLARYNGOSCOPY N/A 06/01/2020   Procedure: SUSPENDED MICRO DIRECT LARYNGOSCOPY WITH PROLARYN INJECTIONS & JET VENTILATION;  Surgeon: Virgina Grills, MD;  Location: [facility] OR;  Service: ENT;  Laterality: N/A;   POLYPECTOMY     TONSILLECTOMY AND ADENOIDECTOMY     VASECTOMY  1978   Family History  Problem Relation Age of Onset   Arthritis Mother        Rhuematoid   Heart disease Father    Colon cancer Neg Hx    Esophageal cancer Neg Hx    Rectal cancer Neg Hx    Stomach cancer Neg Hx    Pancreatic cancer Neg Hx    Prostate cancer Neg Hx    Colon polyps Neg Hx    Social History   Socioeconomic History   Marital status: Married    Spouse name: Stana Ear   Number of children: 1   Years of education: Not on file   Highest education level: Not on file  Occupational History   Occupation: RETIRED  Tobacco Use   Smoking status: Former    Current packs/day: 0.00    Types: Cigarettes    Quit date: 07/23/2008    Years since quitting: 15.3   Smokeless tobacco: Never  Vaping Use   Vaping status: Never Used  Substance and Sexual Activity   Alcohol use: Yes    Alcohol/week: 2.0 standard drinks of alcohol    Types: 2 Cans of beer per week    Comment: occassionally I will drink about 2 beers   Drug use: No   Sexual activity: Not Currently  Other Topics Concern   Not on file  Social History Narrative   Lives with wife.   Social Drivers of Corporate investment banker Strain: Low Risk  (11/27/2023)   Overall Financial  Resource Strain (CARDIA)    Difficulty of Paying Living Expenses: Not hard at all  Food Insecurity: No Food Insecurity (11/27/2023)   Hunger Vital Sign    Worried About Running Out of Food in the Last Year: Never true    Ran Out of Food in the Last Year: Never true  Transportation Needs: No Transportation Needs (11/27/2023)   PRAPARE - Administrator, Civil Service (Medical): No    Lack of Transportation (Non-Medical): No  Physical Activity: Inactive (11/27/2023)   Exercise Vital Sign    Days of Exercise per Week: 0 days    Minutes of Exercise per Session: 0 min  Stress: No Stress Concern Present (11/27/2023)   [employer] of Occupational Health -  Occupational Stress Questionnaire    Feeling of Stress : Not at all  Social Connections: Moderately Isolated (11/27/2023)   Social Connection and Isolation Panel [NHANES]    Frequency of Communication with Friends and Family: More than three times a week    Frequency of Social Gatherings with Friends and Family: Twice a week    Attends Religious Services: Never    Database administrator or Organizations: No    Attends Engineer, structural: Never    Marital Status: Married    Tobacco Counseling Counseling given: Not Answered    Clinical Intake:  Pre-visit preparation completed: Yes  Pain : No/denies pain     BMI - recorded: 25.46 Nutritional Status: BMI 25 -29 Overweight Nutritional Risks: None Diabetes: No  Lab Results  Component Value Date   HGBA1C 6.1 05/13/2023   HGBA1C 6.2 05/10/2022   HGBA1C 6.2 05/09/2021     How often do you need to have someone help you when you read instructions, pamphlets, or other written materials from your doctor or pharmacy?: 1 - Never  Interpreter Needed?: No  Information entered by :: Wafaa Deemer, RMA   Activities of Daily Living     11/27/2023    1:19 PM  In your present state of health, do you have any difficulty performing the following activities:  Hearing?  0  Vision? 0  Difficulty concentrating or making decisions? 0  Walking or climbing stairs? 0  Dressing or bathing? 0  Doing errands, shopping? 0  Preparing Food and eating ? N  Using the Toilet? N  In the past six months, have you accidently leaked urine? N  Do you have problems with loss of bowel control? N  Managing your Medications? N  Managing your Finances? N  Housekeeping or managing your Housekeeping? N    Patient Care Team: Roslyn Coombe, MD as PCP - General Isidro Margo, DO as Consulting Physician (Family Medicine) Asencion Blacksmith, MD (Inactive) as Consulting Physician (Gastroenterology)  Indicate any recent Medical Services you may have received from other than [facility] providers in the past year (date may be approximate).     Assessment:   This is a routine wellness examination for Bauer.  Hearing/Vision screen Hearing Screening - Comments:: Denies hearing difficulties   Vision Screening - Comments:: Denies vision issues./ no eye care    Goals Addressed             This Visit's Progress    My goal is to keep waking up every morning.   On track      Depression Screen     11/27/2023    1:32 PM 05/13/2023    9:22 AM 11/26/2022    9:20 AM 05/10/2022    9:40 AM 05/10/2022    9:01 AM 10/30/2021   10:25 AM 10/30/2021   10:21 AM  PHQ 2/9 Scores  PHQ - 2 Score 3 0 0 1 0 0 0  PHQ- 9 Score 3    0      Fall Risk     11/27/2023    1:29 PM 05/13/2023    9:22 AM 11/26/2022    9:19 AM 05/10/2022    9:35 AM 05/10/2022    9:01 AM  Fall Risk   Falls in the past year? 0 0 0 1 1  Number falls in past yr: 0 0 0 0 0  Injury with Fall? 0 0 0 1 1  Risk for fall due to : No Fall  Risks No Fall Risks No Fall Risks  Other (Comment)  Follow up Falls prevention discussed;Falls evaluation completed Falls evaluation completed Falls prevention discussed  Falls evaluation completed    MEDICARE RISK AT HOME:  Medicare Risk at Home Any stairs in or around the home?: Yes (1  flight of stairs) If so, are there any without handrails?: Yes Home free of loose throw rugs in walkways, pet beds, electrical cords, etc?: Yes Adequate lighting in your home to reduce risk of falls?: Yes Life alert?: No Use of a cane, walker or w/c?: No Grab bars in the bathroom?: Yes Shower chair or bench in shower?: No Elevated toilet seat or a handicapped toilet?: No  TIMED UP AND GO:  Was the test performed?  No  Cognitive Function: Declined/Normal: No cognitive concerns noted by patient or family. Patient alert, oriented, able to answer questions appropriately and recall recent events. No signs of memory loss or confusion.        11/26/2022    9:20 AM 11/11/2019    3:00 PM  6CIT Screen  What Year? 0 points 0 points  What month? 0 points 0 points  What time? 0 points 0 points  Count back from 20 0 points 0 points  Months in reverse 0 points 0 points  Repeat phrase 0 points 0 points  Total Score 0 points 0 points    Immunizations Immunization History  Administered Date(s) Administered   Fluad Quad(high Dose 65+) 05/06/2019, 05/06/2020, 04/12/2023   Influenza Split 04/13/2011, 04/09/2012   Influenza Whole 04/12/2010   Influenza, High Dose Seasonal PF 04/26/2017, 04/30/2018   Influenza,inj,Quad PF,6+ Mos 04/10/2013, 04/16/2014, 04/22/2015, 04/25/2016   Influenza-Unspecified 05/01/2021, 04/26/2022   PFIZER(Purple Top)SARS-COV-2 Vaccination 08/11/2019, 08/31/2019, 05/02/2020   Pfizer Covid-19 Vaccine Bivalent Booster 66yrs & up 04/10/2021, 04/26/2022, 04/12/2023   Pneumococcal Conjugate-13 04/30/2014   Pneumococcal Polysaccharide-23 03/08/2009   Td 03/08/2009   Tdap 05/06/2020   Zoster, Live 04/09/2012    Screening Tests Health Maintenance  Topic Date Due   Zoster Vaccines- Shingrix (1 of 2) 02/03/1992   COVID-19 Vaccine (7 - 2024-25 season) 06/07/2023   INFLUENZA VACCINE  02/21/2024   Medicare Annual Wellness (AWV)  11/26/2024   DTaP/Tdap/Td (3 - Td or Tdap)  05/06/2030   Pneumonia Vaccine 66+ Years old  Completed   HPV VACCINES  Aged Out   Meningococcal B Vaccine  Aged Out   Colonoscopy  Discontinued   Hepatitis C Screening  Discontinued    Health Maintenance  Health Maintenance Due  Topic Date Due   Zoster Vaccines- Shingrix (1 of 2) 02/03/1992   COVID-19 Vaccine (7 - 2024-25 season) 06/07/2023   Health Maintenance Items Addressed: See Nurse Notes  Additional Screening:  Vision Screening: Recommended annual ophthalmology exams for early detection of glaucoma and other disorders of the eye.  Dental Screening: Recommended annual dental exams for proper oral hygiene  Community Resource Referral / Chronic Care Management: CRR required this visit?  No   CCM required this visit?  No     Plan:     I have personally reviewed and noted the following in the patient's chart:   Medical and social history Use of alcohol, tobacco or illicit drugs  Current medications and supplements including opioid prescriptions. Patient is not currently taking opioid prescriptions. Functional ability and status Nutritional status Physical activity Advanced directives List of other physicians Hospitalizations, surgeries, and ER visits in previous 12 months Vitals Screenings to include cognitive, depression, and falls Referrals and appointments  In  addition, I have reviewed and discussed with patient certain preventive protocols, quality metrics, and best practice recommendations. A written personalized care plan for preventive services as well as general preventive health recommendations were provided to patient.     Romulo Okray L Preethi Scantlebury, CMA   11/27/2023   After Visit Summary: (MyChart) Due to this being a telephonic visit, the after visit summary with patients personalized plan was offered to patient via MyChart   Notes: Please refer to Routing Comments.

## 2023-11-27 NOTE — Patient Instructions (Addendum)
 Brian Branch , Thank you for taking time to come for your Medicare Wellness Visit. I appreciate your ongoing commitment to your health goals. Please review the following plan we discussed and let me know if I can assist you in the future.   Referrals/Orders/Follow-Ups/Clinician Recommendations: It was nice to speak with you today.  You will be due for a Cologuad in October 2025.     This is a list of the screening recommended for you and due dates:  Health Maintenance  Topic Date Due   Zoster (Shingles) Vaccine (1 of 2) 02/03/1992   COVID-19 Vaccine (7 - 2024-25 season) 06/07/2023   Medicare Annual Wellness Visit  11/26/2023   Flu Shot  02/21/2024   DTaP/Tdap/Td vaccine (3 - Td or Tdap) 05/06/2030   Pneumonia Vaccine  Completed   HPV Vaccine  Aged Out   Meningitis B Vaccine  Aged Out   Colon Cancer Screening  Discontinued   Hepatitis C Screening  Discontinued    Advanced directives: (Copy Requested) Please bring a copy of your health care power of attorney and living will to the office to be added to your chart at your convenience. You can mail to [facility] [address] [place], [place] [address] or email to [email or web address]  Next Medicare Annual Wellness Visit scheduled for next year: Yes  Have you seen your provider in the last 6 months (3 months if uncontrolled diabetes)? No.  Last OV on 05/13/23.

## 2023-12-06 NOTE — Progress Notes (Signed)
 [facility] [address] [place] [address] Phone: [phone] Subjective:   Brian Branch, am serving as a scribe for Dr. Ronnell Coins.  I'm seeing this patient by the request  of:  Brian Coombe, MD  CC: Shoulder and hip pain  [id number]  09/30/2023 Patient given injection today and tolerated the procedure well, discussed icing regimen and home exercises, which activities to do and which ones to avoid.  Increase activity slowly.  Discussed icing regimen.  Patient knows that there is some arthritic changes and would want to avoid any surgical intervention if possible.  Hopeful patient responds well to the injections.  Discussed keeping hands within peripheral vision.  Follow-up again in 6 to 8 weeks     Patient does have x-ray showing that there was a potential humeral head fracture previously.  We discussed the potential of advanced imaging.  The patient states that that he would not want to do any significant changes in conservative therapy at this time.  Patient would not want any surgical intervention.  Denies any fevers or chills or any abnormal weight loss.  So we will see how patient responds to the injection.  Follow-up again in 10 to 12 weeks.     Updated 12/09/2023 Brian Branch is a 83 y.o. male coming in with complaint of R shoulder and L hip pain. Patient states that injection was not helpful in the R shoulder. Less than one week of relief. Patient uses heat in the mornings and this helps to alleviate his pain throughout the day.    L piriformis pain that radiates down into lateral aspect of upper leg. Hx of GT bursitis in R hip.        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   HYPERLIPIDEMIA 02/15/2007   INSOMNIA-SLEEP DISORDER-UNSPEC 03/09/2010   KNEE PAIN, LEFT 08/29/2007   MYOCARDIAL INFARCTION, 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   SHINGLES, 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   TOBACCO ABUSE 03/09/2010   Unspecified eustachian tube disorder 03/09/2010   Past Surgical History:  Procedure Laterality Date   APPENDECTOMY  1951   CARDIAC CATHETERIZATION     1991- [place]   COLONOSCOPY     CORONARY ARTERY BYPASS GRAFT  1991   MICROLARYNGOSCOPY N/A 06/01/2020   Procedure: SUSPENDED MICRO DIRECT LARYNGOSCOPY WITH PROLARYN INJECTIONS & JET VENTILATION;  Surgeon: Virgina Grills, MD;  Location: [facility] OR;  Service: ENT;  Laterality: N/A;   POLYPECTOMY     TONSILLECTOMY AND ADENOIDECTOMY     VASECTOMY  1978   Social History   Socioeconomic History   Marital status: Married    Spouse name: Brian Branch   Number of children: 1   Years of education: Not on file   Highest education level: Not on file  Occupational History   Occupation: RETIRED  Tobacco Use   Smoking status: Former    Current packs/day: 0.00    Types: Cigarettes    Quit date: 07/23/2008    Years since quitting: 15.3  Smokeless tobacco: Never  Vaping Use   Vaping status: Never Used  Substance and Sexual Activity   Alcohol use: Yes    Alcohol/week: 2.0 standard drinks of alcohol    Types: 2 Cans of beer per week    Comment: occassionally I will drink about 2 beers   Drug use: No   Sexual activity: Not Currently  Other Topics Concern   Not on file  Social History Narrative   Lives with wife.   Social Drivers of Corporate investment banker Strain: Low Risk  (11/27/2023)   Overall Financial Resource Strain (CARDIA)    Difficulty of Paying Living Expenses: Not hard at all  Food Insecurity: No Food Insecurity (11/27/2023)   Hunger Vital Sign    Worried About Running  Out of Food in the Last Year: Never true    Ran Out of Food in the Last Year: Never true  Transportation Needs: No Transportation Needs (11/27/2023)   PRAPARE - Administrator, Civil Service (Medical): No    Lack of Transportation (Non-Medical): No  Physical Activity: Inactive (11/27/2023)   Exercise Vital Sign    Days of Exercise per Week: 0 days    Minutes of Exercise per Session: 0 min  Stress: No Stress Concern Present (11/27/2023)   [employer] of Occupational Health - Occupational Stress Questionnaire    Feeling of Stress : Not at all  Social Connections: Moderately Isolated (11/27/2023)   Social Connection and Isolation Panel [NHANES]    Frequency of Communication with Friends and Family: More than three times a week    Frequency of Social Gatherings with Friends and Family: Twice a week    Attends Religious Services: Never    Database administrator or Organizations: No    Attends Engineer, structural: Never    Marital Status: Married   Allergies  Allergen Reactions   Levitra [Vardenafil Hydrochloride]     headache   Lincomycin Hives   Penicillins Hives    REACTION: 40 years ago   Tadalafil     REACTION: headache   Triazolam     REACTION: hallucinations   Vardenafil Nausea And Vomiting    headache   Family History  Problem Relation Age of Onset   Arthritis Mother        Rhuematoid   Heart disease Father    Colon cancer Neg Hx    Esophageal cancer Neg Hx    Rectal cancer Neg Hx    Stomach cancer Neg Hx    Pancreatic cancer Neg Hx    Prostate cancer Neg Hx    Colon polyps Neg Hx      Current Outpatient Medications (Cardiovascular):    rosuvastatin  (CRESTOR ) 20 MG tablet, Take 1 tablet (20 mg total) by mouth at bedtime.  Current Outpatient Medications (Respiratory):    diphenhydrAMINE (BENADRYL) 25 mg capsule, Take 25 mg by mouth in the morning and at bedtime. Allergies   fluticasone  (FLONASE ) 50 MCG/ACT nasal spray, Place 2 sprays into  both nostrils in the morning and at bedtime.  Current Outpatient Medications (Analgesics):    acetaminophen  (TYLENOL ) 500 MG tablet, Take 1,000 mg by mouth every 6 (six) hours as needed for mild pain or moderate pain. Rapid release   aspirin  EC (ASPIRIN  LOW DOSE) 81 MG tablet, TAKE 1 TABLET DAILY (SWALLOW WHOLE)  Current Outpatient Medications (Hematological):    Cyanocobalamin  (VITAMIN B-12) 5000 MCG SUBL, Place 5,000 mcg under the tongue daily.  Current Outpatient Medications (Other):  Cholecalciferol  50 MCG (2000 UT) TABS, 1 tab by mouth once daily   omeprazole  (PRILOSEC) 40 MG capsule, Take 1 capsule (40 mg total) by mouth in the morning and at bedtime.   solifenacin  (VESICARE ) 5 MG tablet, Take 1 tablet (5 mg total) by mouth daily.   tamsulosin  (FLOMAX ) 0.4 MG CAPS capsule, Take 1 capsule (0.4 mg total) by mouth daily.   Reviewed prior external information including notes and imaging from  primary care provider As well as notes that were available from care everywhere and other healthcare systems.  Past medical history, social, surgical and family history all reviewed in electronic medical record.  No pertanent information unless stated regarding to the chief complaint.   Review of Systems:  No headache, visual changes, nausea, vomiting, diarrhea, constipation, dizziness, abdominal pain, skin rash, fevers, chills, night sweats, weight loss, swollen lymph nodes, body aches, j, chest pain, shortness of breath, mood changes. POSITIVE muscle aches, joint swelling  Objective  Blood pressure 124/62, pulse 68, height 5\' 5"  (1.651 m), weight 152 lb (68.9 kg), SpO2 98%.   General: No apparent distress alert and oriented x3 mood and affect normal, dressed appropriately.  HEENT: Pupils equal, extraocular movements intact  Respiratory: Patient's speak in full sentences and does not appear short of breath  Cardiovascular: No lower extremity edema, non tender, no erythema  Patient shoulder  exam shows still some positive impingement noted.  seems to be more of the right shoulder with impingement noted with certain range of motion.  Severe overall. Left hip minor greater trochanteric pain.  Back exam does have some loss lordosis.  Negative's straight leg test.    Impression and Recommendations:    The above documentation has been reviewed and is accurate and complete Sicilia Killough M Wilhelmina Hark, DO

## 2023-12-09 ENCOUNTER — Ambulatory Visit: Admitting: Family Medicine

## 2023-12-09 ENCOUNTER — Other Ambulatory Visit: Payer: Self-pay

## 2023-12-09 VITALS — BP 124/62 | HR 68 | Ht 65.0 in | Wt 152.0 lb

## 2023-12-09 DIAGNOSIS — M25512 Pain in left shoulder: Secondary | ICD-10-CM

## 2023-12-09 DIAGNOSIS — M75101 Unspecified rotator cuff tear or rupture of right shoulder, not specified as traumatic: Secondary | ICD-10-CM | POA: Diagnosis not present

## 2023-12-09 DIAGNOSIS — M12811 Other specific arthropathies, not elsewhere classified, right shoulder: Secondary | ICD-10-CM

## 2023-12-09 DIAGNOSIS — M25511 Pain in right shoulder: Secondary | ICD-10-CM

## 2023-12-09 NOTE — Assessment & Plan Note (Signed)
 Chronic issue noted.  Discussed with patient about potentially advanced imaging just to know if anything else can be done with the arthritic changes or the rotator cuff.  Patient declined with it not affecting daily activities at the moment.  Discussed icing regimen and home exercises, which activities to do and which ones to avoid.  Increase activity slowly.  Follow-up again in 6 to 8 weeks

## 2023-12-09 NOTE — Patient Instructions (Signed)
 Exercises No other changes See me in 2-3 months

## 2023-12-10 ENCOUNTER — Encounter: Payer: Self-pay | Admitting: Internal Medicine

## 2024-03-03 NOTE — Progress Notes (Deleted)
 [facility] [address] [place] [address] Phone: [phone] Subjective:    I'm seeing this patient by the request  of:  [facility] ORN, MD  CC:   YEP:Dlagzrupcz  12/09/2023 Chronic issue noted.  Discussed with patient about potentially advanced imaging just to know if anything else can be done with the arthritic changes or the rotator cuff.  Patient declined with it not affecting daily activities at the moment.  Discussed icing regimen and home exercises, which activities to do and which ones to avoid.  Increase activity slowly.  Follow-up again in 6 to 8 weeks     Updated 03/10/2024 Brian Branch is a 83 y.o. 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   DEPRESSION 08/29/2007   pt denies having depression   ED (erectile dysfunction)    FATIGUE 08/29/2007   GERD (gastroesophageal reflux disease)    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   HYPERLIPIDEMIA 02/15/2007   INSOMNIA-SLEEP DISORDER-UNSPEC 03/09/2010   KNEE PAIN, LEFT 08/29/2007   MYOCARDIAL INFARCTION, 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   SHINGLES, 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   TOBACCO ABUSE 03/09/2010   Unspecified eustachian tube disorder 03/09/2010   Past Surgical History:  Procedure Laterality Date   APPENDECTOMY  1951   CARDIAC CATHETERIZATION     1991- [place] [facility]   COLONOSCOPY     CORONARY ARTERY BYPASS GRAFT  1991   MICROLARYNGOSCOPY N/A 06/01/2020   Procedure: SUSPENDED MICRO DIRECT LARYNGOSCOPY WITH PROLARYN INJECTIONS & JET VENTILATION;  Surgeon: Carlie Clark, MD;  Location: [facility] OR;  Service: ENT;  Laterality: N/A;   POLYPECTOMY     TONSILLECTOMY AND ADENOIDECTOMY     VASECTOMY  1978   Social History    Socioeconomic History   Marital status: Married    Spouse name: Rock   Number of children: 1   Years of education: Not on file   Highest education level: Not on file  Occupational History   Occupation: RETIRED  Tobacco Use   Smoking status: Former    Current packs/day: 0.00    Types: Cigarettes    Quit date: 07/23/2008    Years since quitting: 15.6   Smokeless tobacco: Never  Vaping Use   Vaping status: Never Used  Substance and Sexual Activity   Alcohol use: Yes    Alcohol/week: 2.0 standard drinks of alcohol    Types: 2 Cans of beer per week    Comment: occassionally I will drink about 2 beers   Drug use: No   Sexual activity: Not Currently  Other Topics Concern   Not on file  Social History Narrative   Lives with wife.   Social Drivers of Corporate investment banker Strain: Low Risk  (11/27/2023)   Overall Financial Resource Strain (CARDIA)    Difficulty of Paying Living Expenses: Not hard at all  Food Insecurity: No Food Insecurity (11/27/2023)   Hunger Vital Sign    Worried About Running Out of Food in the Last Year: Never true    Ran Out of Food in the Last Year: Never true  Transportation Needs: No Transportation Needs (11/27/2023)   PRAPARE -  Administrator, Civil Service (Medical): No    Lack of Transportation (Non-Medical): No  Physical Activity: Inactive (11/27/2023)   Exercise Vital Sign    Days of Exercise per Week: 0 days    Minutes of Exercise per Session: 0 min  Stress: No Stress Concern Present (11/27/2023)   [employer] of Occupational Health - Occupational Stress Questionnaire    Feeling of Stress : Not at all  Social Connections: Moderately Isolated (11/27/2023)   Social Connection and Isolation Panel    Frequency of Communication with Friends and Family: More than three times a week    Frequency of Social Gatherings with Friends and Family: Twice a week    Attends Religious Services: Never    Database administrator or Organizations:  No    Attends Engineer, structural: Never    Marital Status: Married   Allergies  Allergen Reactions   Levitra [Vardenafil Hydrochloride]     headache   Lincomycin Hives   Penicillins Hives    REACTION: 40 years ago   Tadalafil     REACTION: headache   Triazolam     REACTION: hallucinations   Vardenafil Nausea And Vomiting    headache   Family History  Problem Relation Age of Onset   Arthritis Mother        Rhuematoid   Heart disease Father    Colon cancer Neg Hx    Esophageal cancer Neg Hx    Rectal cancer Neg Hx    Stomach cancer Neg Hx    Pancreatic cancer Neg Hx    Prostate cancer Neg Hx    Colon polyps Neg Hx      Current Outpatient Medications (Cardiovascular):    rosuvastatin  (CRESTOR ) 20 MG tablet, Take 1 tablet (20 mg total) by mouth at bedtime.  Current Outpatient Medications (Respiratory):    diphenhydrAMINE (BENADRYL) 25 mg capsule, Take 25 mg by mouth in the morning and at bedtime. Allergies   fluticasone  (FLONASE ) 50 MCG/ACT nasal spray, Place 2 sprays into both nostrils in the morning and at bedtime.  Current Outpatient Medications (Analgesics):    acetaminophen  (TYLENOL ) 500 MG tablet, Take 1,000 mg by mouth every 6 (six) hours as needed for mild pain or moderate pain. Rapid release   aspirin  EC (ASPIRIN  LOW DOSE) 81 MG tablet, TAKE 1 TABLET DAILY (SWALLOW WHOLE)  Current Outpatient Medications (Hematological):    Cyanocobalamin  (VITAMIN B-12) 5000 MCG SUBL, Place 5,000 mcg under the tongue daily.  Current Outpatient Medications (Other):    Cholecalciferol  50 MCG (2000 UT) TABS, 1 tab by mouth once daily   omeprazole  (PRILOSEC) 40 MG capsule, Take 1 capsule (40 mg total) by mouth in the morning and at bedtime.   solifenacin  (VESICARE ) 5 MG tablet, Take 1 tablet (5 mg total) by mouth daily.   tamsulosin  (FLOMAX ) 0.4 MG CAPS capsule, Take 1 capsule (0.4 mg total) by mouth daily.   Reviewed prior external information including notes and  imaging from  primary care provider As well as notes that were available from care everywhere and other healthcare systems.  Past medical history, social, surgical and family history all reviewed in electronic medical record.  No pertanent information unless stated regarding to the chief complaint.   Review of Systems:  No headache, visual changes, nausea, vomiting, diarrhea, constipation, dizziness, abdominal pain, skin rash, fevers, chills, night sweats, weight loss, swollen lymph nodes, body aches, joint swelling, chest pain, shortness of breath, mood changes. POSITIVE muscle aches  Objective  There were no vitals taken for this visit.   General: No apparent distress alert and oriented x3 mood and affect normal, dressed appropriately.  HEENT: Pupils equal, extraocular movements intact  Respiratory: Patient's speak in full sentences and does not appear short of breath  Cardiovascular: No lower extremity edema, non tender, no erythema      Impression and Recommendations:

## 2024-03-07 ENCOUNTER — Other Ambulatory Visit: Payer: Self-pay | Admitting: Internal Medicine

## 2024-03-10 ENCOUNTER — Ambulatory Visit: Admitting: Family Medicine

## 2024-03-31 ENCOUNTER — Other Ambulatory Visit (HOSPITAL_COMMUNITY): Payer: Self-pay

## 2024-03-31 ENCOUNTER — Telehealth: Payer: Self-pay

## 2024-03-31 NOTE — Telephone Encounter (Signed)
 Pharmacy Patient Advocate Encounter  Insurance verification completed.   The patient is insured through [facility]   Ran test claim for Fluticasone  Propionate 50MCG/ACT suspension. Currently a quantity of 1 bottle at 16 grams  is a 30 day supply and the co-pay is $9.00 . No PA needed at this time.  PLEASE BE ADVISED PT PLAN WILL ONLY PAY FOR 1 BOTTLE AT 30 DAY SUPPLY AT A TIME PER PLAN.   This test claim was processed through [facility]- copay amounts may vary at other pharmacies due to pharmacy/plan contracts, or as the patient moves through the different stages of their insurance plan.

## 2024-04-01 ENCOUNTER — Other Ambulatory Visit: Payer: Self-pay | Admitting: Internal Medicine

## 2024-04-06 ENCOUNTER — Other Ambulatory Visit: Payer: Self-pay | Admitting: Internal Medicine

## 2024-04-07 ENCOUNTER — Encounter: Payer: Self-pay | Admitting: Internal Medicine

## 2024-04-09 ENCOUNTER — Other Ambulatory Visit: Payer: Self-pay

## 2024-04-09 MED ORDER — FLUTICASONE PROPIONATE 50 MCG/ACT NA SUSP: 2.0000 | Freq: Two times a day (BID) | NASAL | 3 refills | Status: DC

## 2024-04-11 ENCOUNTER — Other Ambulatory Visit: Payer: Self-pay | Admitting: Internal Medicine

## 2024-04-14 ENCOUNTER — Other Ambulatory Visit (HOSPITAL_COMMUNITY): Payer: Self-pay

## 2024-04-14 ENCOUNTER — Other Ambulatory Visit: Payer: Self-pay

## 2024-04-14 MED ORDER — FLUTICASONE PROPIONATE 50 MCG/ACT NA SUSP: 2.0000 | Freq: Two times a day (BID) | NASAL | 3 refills | Status: DC

## 2024-04-20 ENCOUNTER — Other Ambulatory Visit: Payer: Self-pay

## 2024-04-20 MED ORDER — FLUTICASONE PROPIONATE 50 MCG/ACT NA SUSP: 2.0000 | Freq: Two times a day (BID) | NASAL | 3 refills | Status: DC

## 2024-04-23 MED ORDER — MOMETASONE FUROATE 50 MCG/ACT NA SUSP: 2.0000 | Freq: Every day | NASAL | 3 refills | Status: DC

## 2024-04-29 ENCOUNTER — Telehealth: Payer: Self-pay

## 2024-04-29 MED ORDER — FLUTICASONE PROPIONATE 50 MCG/ACT NA SUSP: 2.0000 | Freq: Two times a day (BID) | NASAL | 3 refills | Status: DC

## 2024-04-29 NOTE — Telephone Encounter (Signed)
 Copied from CRM [id number]. Topic: Clinical - Prescription Issue >> Apr 29, 2024 10:51 AM Dedra B wrote: Reason for CRM: Pt said he's been trying to get his fluticasone  (FLONASE ) 50 MCG/ACT nasal spray  two months. He said he's been receiving letters from the pharmacy where they are not receiving the correct information regarding the prescription. Pt is upset and would like to discuss the issue with Dr. Norleen.

## 2024-05-12 ENCOUNTER — Ambulatory Visit: Payer: Medicare Other | Admitting: Internal Medicine

## 2024-05-12 ENCOUNTER — Ambulatory Visit: Payer: Self-pay | Admitting: Internal Medicine

## 2024-05-12 ENCOUNTER — Encounter: Payer: Self-pay | Admitting: Internal Medicine

## 2024-05-12 VITALS — BP 120/64 | HR 72 | Temp 98.7°F | Ht 65.0 in | Wt 153.0 lb

## 2024-05-12 DIAGNOSIS — I080 Rheumatic disorders of both mitral and aortic valves: Secondary | ICD-10-CM | POA: Diagnosis not present

## 2024-05-12 DIAGNOSIS — E538 Deficiency of other specified B group vitamins: Secondary | ICD-10-CM

## 2024-05-12 DIAGNOSIS — E559 Vitamin D deficiency, unspecified: Secondary | ICD-10-CM | POA: Diagnosis not present

## 2024-05-12 DIAGNOSIS — Z0001 Encounter for general adult medical examination with abnormal findings: Secondary | ICD-10-CM

## 2024-05-12 DIAGNOSIS — R739 Hyperglycemia, unspecified: Secondary | ICD-10-CM

## 2024-05-12 DIAGNOSIS — E785 Hyperlipidemia, unspecified: Secondary | ICD-10-CM | POA: Diagnosis not present

## 2024-05-12 DIAGNOSIS — Z0000 Encounter for general adult medical examination without abnormal findings: Secondary | ICD-10-CM

## 2024-05-12 LAB — HEPATIC FUNCTION PANEL
ALT: 19 U/L (ref 0–53)
AST: 25 U/L (ref 0–37)
Albumin: 4.4 g/dL (ref 3.5–5.2)
Alkaline Phosphatase: 40 U/L (ref 39–117)
Bilirubin, Direct: 0.2 mg/dL (ref 0.0–0.3)
Total Bilirubin: 0.7 mg/dL (ref 0.2–1.2)
Total Protein: 7.3 g/dL (ref 6.0–8.3)

## 2024-05-12 LAB — BASIC METABOLIC PANEL WITH GFR
BUN: 17 mg/dL (ref 6–23)
CO2: 26 meq/L (ref 19–32)
Calcium: 9 mg/dL (ref 8.4–10.5)
Chloride: 102 meq/L (ref 96–112)
Creatinine, Ser: 1.02 mg/dL (ref 0.40–1.50)
GFR: 68.56 mL/min (ref >60.00)
Glucose, Bld: 100 mg/dL — ABNORMAL HIGH (ref 70–99)
Potassium: 3.8 meq/L (ref 3.5–5.1)
Sodium: 137 meq/L (ref 135–145)

## 2024-05-12 LAB — URINALYSIS, ROUTINE W REFLEX MICROSCOPIC
Bilirubin Urine: NEGATIVE
Hgb urine dipstick: NEGATIVE
Ketones, ur: NEGATIVE
Leukocytes,Ua: NEGATIVE
Nitrite: NEGATIVE
RBC / HPF: NONE SEEN (ref 0–?)
Specific Gravity, Urine: 1.01 (ref 1.000–1.030)
Total Protein, Urine: NEGATIVE
Urine Glucose: NEGATIVE
Urobilinogen, UA: 0.2 (ref 0.0–1.0)
pH: 6 (ref 5.0–8.0)

## 2024-05-12 LAB — LIPID PANEL
Cholesterol: 105 mg/dL (ref 0–200)
HDL: 31.7 mg/dL — ABNORMAL LOW (ref >39.00)
LDL Cholesterol: 42 mg/dL (ref 0–99)
NonHDL: 72.97
Total CHOL/HDL Ratio: 3
Triglycerides: 155 mg/dL — ABNORMAL HIGH (ref 0.0–149.0)
VLDL: 31 mg/dL (ref 0.0–40.0)

## 2024-05-12 LAB — CBC WITH DIFFERENTIAL/PLATELET
Basophils Absolute: 0.1 K/uL (ref 0.0–0.1)
Basophils Relative: 0.8 % (ref 0.0–3.0)
Eosinophils Absolute: 0.4 K/uL (ref 0.0–0.7)
Eosinophils Relative: 4.7 % (ref 0.0–5.0)
HCT: 42.5 % (ref 39.0–52.0)
Hemoglobin: 14.2 g/dL (ref 13.0–17.0)
Lymphocytes Relative: 16.6 % (ref 12.0–46.0)
Lymphs Abs: 1.2 K/uL (ref 0.7–4.0)
MCHC: 33.4 g/dL (ref 30.0–36.0)
MCV: 93 fl (ref 78.0–100.0)
Monocytes Absolute: 0.6 K/uL (ref 0.1–1.0)
Monocytes Relative: 7.4 % (ref 3.0–12.0)
Neutro Abs: 5.3 K/uL (ref 1.4–7.7)
Neutrophils Relative %: 70.5 % (ref 43.0–77.0)
Platelets: 225 K/uL (ref 150.0–400.0)
RBC: 4.57 Mil/uL (ref 4.22–5.81)
RDW: 13.2 % (ref 11.5–15.5)
WBC: 7.5 K/uL (ref 4.0–10.5)

## 2024-05-12 LAB — TSH: TSH: 1.56 u[IU]/mL (ref 0.35–5.50)

## 2024-05-12 LAB — VITAMIN D 25 HYDROXY (VIT D DEFICIENCY, FRACTURES): VITD: 51.68 ng/mL (ref 30.00–100.00)

## 2024-05-12 LAB — HEMOGLOBIN A1C: Hgb A1c MFr Bld: 6.2 % (ref 4.6–6.5)

## 2024-05-12 LAB — VITAMIN B12: Vitamin B-12: >1500 pg/mL — ABNORMAL HIGH (ref 211–911)

## 2024-05-12 MED ORDER — FLUTICASONE PROPIONATE 50 MCG/ACT NA SUSP: 2.0000 | Freq: Two times a day (BID) | NASAL | 3 refills | Status: AC

## 2024-05-12 MED ORDER — SOLIFENACIN SUCCINATE 5 MG PO TABS: 5.0000 mg | ORAL_TABLET | Freq: Every day | ORAL | 3 refills | Status: AC

## 2024-05-12 MED ORDER — TAMSULOSIN HCL 0.4 MG PO CAPS: 0.4000 mg | ORAL_CAPSULE | Freq: Every day | ORAL | 3 refills | Status: AC

## 2024-05-12 MED ORDER — ROSUVASTATIN CALCIUM 20 MG PO TABS
20.0000 mg | ORAL_TABLET | Freq: Every day | ORAL | 3 refills | Status: AC
Start: 2024-05-12 — End: ?

## 2024-05-12 MED ORDER — OMEPRAZOLE 40 MG PO CPDR: 40.0000 mg | DELAYED_RELEASE_CAPSULE | Freq: Two times a day (BID) | ORAL | 0 refills | Status: DC

## 2024-05-12 NOTE — Patient Instructions (Signed)

## 2024-05-12 NOTE — Progress Notes (Signed)
 Patient ID: Brian Branch, male   DOB: Apr 24, 1941, 83 y.o.   MRN: [id number]         Chief Complaint:: wellness exam and hyperglycemia, low vit d, hld, low b12       HPI:  Brian Branch is a 83 y.o. male here for wellness exam; for shingrix at pharmacy, o/w up to date                        Also Pt denies chest pain, increased sob or doe, wheezing, orthopnea, PND, increased LE swelling, palpitations, dizziness or syncope.   Pt denies polydipsia, polyuria, or new focal neuro s/s.    Pt denies fever, wt loss, night sweats, loss of appetite, or other constitutional symptoms     Wt Readings from Last 3 Encounters:  05/12/24 153 lb (69.4 kg)  12/09/23 152 lb (68.9 kg)  11/27/23 153 lb (69.4 kg)   BP Readings from Last 3 Encounters:  05/12/24 120/64  12/09/23 124/62  09/30/23 114/78   Immunization History  Administered Date(s) Administered   Fluad Quad(high Dose 65+) 05/06/2019, 05/06/2020, 04/12/2023, 05/05/2024   INFLUENZA, HIGH DOSE SEASONAL PF 04/26/2017, 04/30/2018   Influenza Split 04/13/2011, 04/09/2012   Influenza Whole 04/12/2010   Influenza,inj,Quad PF,6+ Mos 04/10/2013, 04/16/2014, 04/22/2015, 04/25/2016   Influenza-Unspecified 05/01/2021, 04/26/2022   PFIZER(Purple Top)SARS-COV-2 Vaccination 08/11/2019, 08/31/2019, 05/02/2020   Pfizer Covid-19 Vaccine Bivalent Booster 51yrs & up 04/10/2021, 04/26/2022, 04/12/2023   Pneumococcal Conjugate-13 04/30/2014   Pneumococcal Polysaccharide-23 03/08/2009   Td 03/08/2009   Tdap 05/06/2020   Zoster, Live 04/09/2012   Health Maintenance Due  Topic Date Due   Zoster Vaccines- Shingrix (1 of 2) 02/03/1992   COVID-19 Vaccine (7 - 2025-26 season) 03/23/2024      Past Medical History:  Diagnosis Date   ALLERGIC RHINITIS 02/15/2007   Allergy    BENIGN PROSTATIC HYPERTROPHY 02/15/2007   CAD (coronary artery disease)    COLONIC POLYPS, HX OF 08/29/2007   CORONARY ARTERY DISEASE 02/15/2007   DEGENERATIVE JOINT DISEASE, CERVICAL  SPINE 02/15/2007   DEPRESSION 08/29/2007   pt denies having depression   ED (erectile dysfunction)    FATIGUE 08/29/2007   GERD (gastroesophageal reflux disease)    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   HYPERLIPIDEMIA 02/15/2007   INSOMNIA-SLEEP DISORDER-UNSPEC 03/09/2010   KNEE PAIN, LEFT 08/29/2007   MYOCARDIAL INFARCTION, 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   SHINGLES, 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   TOBACCO ABUSE 03/09/2010   Unspecified eustachian tube disorder 03/09/2010   Past Surgical History:  Procedure Laterality Date   APPENDECTOMY  1951   CARDIAC CATHETERIZATION     1991- [place]   COLONOSCOPY     CORONARY ARTERY BYPASS GRAFT  1991   MICROLARYNGOSCOPY N/A 06/01/2020   Procedure: SUSPENDED MICRO DIRECT LARYNGOSCOPY WITH PROLARYN INJECTIONS & JET VENTILATION;  Surgeon: Carlie Clark, MD;  Location: [facility] OR;  Service: ENT;  Laterality: N/A;   POLYPECTOMY     TONSILLECTOMY AND ADENOIDECTOMY     VASECTOMY  1978    reports that he quit smoking about 15 years ago. His smoking use included cigarettes. He has never used smokeless tobacco. He reports current alcohol use of about 2.0 standard drinks of alcohol per week. He reports that he does not use drugs. family history includes Arthritis in his mother; Heart disease in his father. Allergies  Allergen Reactions   Levitra [Vardenafil Hydrochloride]     headache   Lincomycin Hives  Penicillins Hives    REACTION: 40 years ago   Tadalafil     REACTION: headache   Triazolam     REACTION: hallucinations   Vardenafil Nausea And Vomiting    headache   Current Outpatient Medications on File Prior to Visit  Medication Sig Dispense Refill   acetaminophen  (TYLENOL ) 500 MG tablet Take 1,000 mg by mouth every 6 (six) hours as needed for mild pain or moderate pain. Rapid release     aspirin  EC (ASPIRIN  LOW DOSE) 81 MG tablet TAKE 1 TABLET DAILY (SWALLOW WHOLE) 90 tablet 3   Cholecalciferol  50 MCG (2000 UT) TABS 1 tab by mouth once daily 90 tablet 3   Cyanocobalamin   (VITAMIN B-12) 5000 MCG SUBL Place 5,000 mcg under the tongue daily.     diphenhydrAMINE (BENADRYL) 25 mg capsule Take 25 mg by mouth in the morning and at bedtime. Allergies     No current facility-administered medications on file prior to visit.        ROS:  All others reviewed and negative.  Objective        PE:  BP 120/64 (BP Location: Right Arm, Patient Position: Sitting, Cuff Size: Normal)   Pulse 72   Temp 98.7 F (37.1 C) (Oral)   Ht 5' 5 (1.651 m)   Wt 153 lb (69.4 kg)   SpO2 98%   BMI 25.46 kg/m                 Constitutional: Pt appears in NAD               HENT: Head: NCAT.                Right Ear: External ear normal.                 Left Ear: External ear normal.                Eyes: . Pupils are equal, round, and reactive to light. Conjunctivae and EOM are normal               Nose: without d/c or deformity               Neck: Neck supple. Gross normal ROM               Cardiovascular: Normal rate and regular rhythm.                 Pulmonary/Chest: Effort normal and breath sounds without rales or wheezing.                Abd:  Soft, NT, ND, + BS, no organomegaly               Neurological: Pt is alert. At baseline orientation, motor grossly intact               Skin: Skin is warm. No rashes, no other new lesions, LE edema - none               Psychiatric: Pt behavior is normal without agitation   Micro: none  Cardiac tracings I have personally interpreted today:  none  Pertinent Radiological findings (summarize): none   Lab Results  Component Value Date   WBC 7.5 05/12/2024   HGB 14.2 05/12/2024   HCT 42.5 05/12/2024   PLT 225.0 05/12/2024   GLUCOSE 100 (H) 05/12/2024   CHOL 105 05/12/2024   TRIG 155.0 (H) 05/12/2024   HDL 31.70 (L) 05/12/2024  LDLDIRECT 72.0 04/30/2018   LDLCALC 42 05/12/2024   ALT 19 05/12/2024   AST 25 05/12/2024   NA 137 05/12/2024   K 3.8 05/12/2024   CL 102 05/12/2024   CREATININE 1.02 05/12/2024   BUN 17 05/12/2024    CO2 26 05/12/2024   TSH 1.56 05/12/2024   PSA 1.73 05/09/2021   HGBA1C 6.2 05/12/2024   Assessment/Plan:  Brian Branch is a 83 y.o. White or Caucasian [1] male with  has a past medical history of ALLERGIC RHINITIS (02/15/2007), Allergy, BENIGN PROSTATIC HYPERTROPHY (02/15/2007), CAD (coronary artery disease), COLONIC POLYPS, HX OF (08/29/2007), CORONARY ARTERY DISEASE (02/15/2007), DEGENERATIVE JOINT DISEASE, CERVICAL SPINE (02/15/2007), DEPRESSION (08/29/2007), ED (erectile dysfunction), FATIGUE (08/29/2007), GERD (gastroesophageal reflux disease), Heart attack (HCC), HYPERLIPIDEMIA (02/15/2007), INSOMNIA-SLEEP DISORDER-UNSPEC (03/09/2010), KNEE PAIN, LEFT (08/29/2007), MYOCARDIAL INFARCTION, HX OF (02/15/2007), SHINGLES, HX OF (02/15/2007), TOBACCO ABUSE (03/09/2010), and Unspecified eustachian tube disorder (03/09/2010).  Encounter for well adult exam with abnormal findings Age and sex appropriate education and counseling updated with regular exercise and diet Referrals for preventative services - none needed Immunizations addressed - for shingrix at pharmacy Smoking counseling  - none needed Evidence for depression or other mood disorder - none significant Most recent labs reviewed. I have personally reviewed and have noted: 1) the patient's medical and social history 2) The patient's current medications and supplements 3) The patient's height, weight, and BMI have been recorded in the chart   Vitamin D  deficiency Last vitamin D  Lab Results  Component Value Date   VD25OH 51.68 05/12/2024   Stable, cont oral replacement   Hyperlipidemia Lab Results  Component Value Date   LDLCALC 42 05/12/2024   Stable, pt to continue current statin crestor  20 mg qd   Hyperglycemia Lab Results  Component Value Date   HGBA1C 6.2 05/12/2024   Stable, pt to continue current medical treatment  - diet, wt control   B12 deficiency Lab Results  Component Value Date   VITAMINB12 >1500 (H) 05/12/2024    Stable, cont oral replacement - b12 1000 mcg qd   Mitral regurgitation and aortic stenosis Per recent echo, mild each, volume stable,  to f/u any worsening symptoms or concerns  Followup: Return in about 6 months (around 11/10/2024).  Lynwood Rush, MD 05/12/2024 8:26 PM [facility]

## 2024-05-12 NOTE — Assessment & Plan Note (Signed)
 Per recent echo, mild each, volume stable,  to f/u any worsening symptoms or concerns

## 2024-05-12 NOTE — Assessment & Plan Note (Signed)
 Lab Results  Component Value Date   VITAMINB12 >1500 (H) 05/12/2024   Stable, cont oral replacement - b12 1000 mcg qd

## 2024-05-12 NOTE — Progress Notes (Signed)
 The test results show that your current treatment is OK, as the tests are stable.  Please continue the same plan.  There is no other need for change of treatment or further evaluation based on these results, at this time.  thanks

## 2024-05-12 NOTE — Assessment & Plan Note (Signed)
 Last vitamin D  Lab Results  Component Value Date   VD25OH 51.68 05/12/2024   Stable, cont oral replacement

## 2024-05-12 NOTE — Assessment & Plan Note (Signed)
 Lab Results  Component Value Date   HGBA1C 6.2 05/12/2024   Stable, pt to continue current medical treatment  - diet, wt control

## 2024-05-12 NOTE — Assessment & Plan Note (Signed)
 Lab Results  Component Value Date   LDLCALC 42 05/12/2024   Stable, pt to continue current statin crestor  20 mg qd

## 2024-05-12 NOTE — Assessment & Plan Note (Signed)

## 2024-07-27 ENCOUNTER — Other Ambulatory Visit: Payer: Self-pay | Admitting: Internal Medicine

## 2024-07-28 ENCOUNTER — Other Ambulatory Visit: Payer: Self-pay

## 2024-08-10 ENCOUNTER — Ambulatory Visit: Admitting: Family Medicine

## 2024-08-10 ENCOUNTER — Encounter: Payer: Self-pay | Admitting: Family Medicine

## 2024-08-10 VITALS — BP 138/78 | HR 68 | Temp 98.2°F | Resp 18 | Ht 65.0 in | Wt 153.0 lb

## 2024-08-10 DIAGNOSIS — J988 Other specified respiratory disorders: Secondary | ICD-10-CM | POA: Diagnosis not present

## 2024-08-10 DIAGNOSIS — B9689 Other specified bacterial agents as the cause of diseases classified elsewhere: Secondary | ICD-10-CM

## 2024-08-10 MED ORDER — AZITHROMYCIN 250 MG PO TABS: ORAL_TABLET | ORAL | 0 refills | Status: AC

## 2024-08-10 NOTE — Progress Notes (Signed)
 "  Assessment & Plan Bacterial respiratory infection Continue symptom management. Education provided on URIs. Orders:   azithromycin  (ZITHROMAX ) 250 MG tablet; Take 2 tablets on day 1, then 1 tablet daily on days 2 through 5   Follow up plan: Return if symptoms worsen or fail to improve, for as scheduled with PCP.  Niki Rung, MSN, APRN, FNP-C  Subjective:  HPI: Brian Branch is a 84 y.o. male presenting on 08/10/2024 for Sore Throat (Started about 1 week ago: symptoms include sore throat, felt feverish one day, has a chronic cough- not currently productive, some sinus drainage - thick. /Gargling salt water, drinking green tea with honey)  Discussed the use of AI scribe software for clinical note transcription with the patient, who gave verbal consent to proceed.  He has been experiencing a sore throat, cough, and feeling feverish for about a week. He notes sinus drainage and has been using home remedies such as gargling with salt water and drinking green tea with honey. Despite these efforts, his symptoms are not improving.  No chest congestion, wheezing, or changes in shortness of breath. His symptoms are primarily located in the sinus area, and he denies any pain or pressure in the ears or sinuses.      ROS: Negative unless specifically indicated above in HPI.   Relevant past medical history reviewed and updated as indicated.   Allergies and medications reviewed and updated.  Current Medications[1]  Allergies[2]  Objective:   BP 138/78   Pulse 68   Temp 98.2 F (36.8 C)   Resp 18   Ht 5' 5 (1.651 m)   Wt 153 lb (69.4 kg)   SpO2 98%   BMI 25.46 kg/m    Physical Exam Vitals reviewed.  Constitutional:      General: He is not in acute distress.    Appearance: Normal appearance. He is not ill-appearing, toxic-appearing or diaphoretic.  HENT:     Head: Normocephalic and atraumatic.     Right Ear: Tympanic membrane, ear canal and external ear normal. There is  no impacted cerumen.     Left Ear: Tympanic membrane, ear canal and external ear normal. There is no impacted cerumen.     Nose: Congestion present.     Right Sinus: No maxillary sinus tenderness or frontal sinus tenderness.     Left Sinus: No maxillary sinus tenderness or frontal sinus tenderness.     Mouth/Throat:     Mouth: Mucous membranes are moist.     Pharynx: Oropharynx is clear. Posterior oropharyngeal erythema present. No oropharyngeal exudate.     Tonsils: No tonsillar exudate or tonsillar abscesses.  Eyes:     General: No scleral icterus.       Right eye: No discharge.        Left eye: No discharge.     Conjunctiva/sclera: Conjunctivae normal.  Cardiovascular:     Rate and Rhythm: Normal rate.     Heart sounds: Murmur heard.  Pulmonary:     Effort: Pulmonary effort is normal. No respiratory distress.  Musculoskeletal:        General: Normal range of motion.     Cervical back: Normal range of motion.  Lymphadenopathy:     Cervical: No cervical adenopathy.  Skin:    General: Skin is warm and dry.  Neurological:     Mental Status: He is alert and oriented to person, place, and time. Mental status is at baseline.  Psychiatric:        Mood  and Affect: Mood normal.        Behavior: Behavior normal.        Thought Content: Thought content normal.        Judgment: Judgment normal.            [1]  Current Outpatient Medications:    acetaminophen  (TYLENOL ) 500 MG tablet, Take 1,000 mg by mouth every 6 (six) hours as needed for mild pain or moderate pain. Rapid release, Disp: , Rfl:    aspirin  EC (ASPIRIN  LOW DOSE) 81 MG tablet, TAKE 1 TABLET DAILY (SWALLOW WHOLE), Disp: 90 tablet, Rfl: 3   azithromycin  (ZITHROMAX ) 250 MG tablet, Take 2 tablets on day 1, then 1 tablet daily on days 2 through 5, Disp: 6 tablet, Rfl: 0   Cholecalciferol  50 MCG (2000 UT) TABS, 1 tab by mouth once daily, Disp: 90 tablet, Rfl: 3   Cyanocobalamin  (VITAMIN B-12) 5000 MCG SUBL, Place 5,000  mcg under the tongue daily., Disp: , Rfl:    diphenhydrAMINE (BENADRYL) 25 mg capsule, Take 25 mg by mouth in the morning and at bedtime. Allergies, Disp: , Rfl:    fluticasone  (FLONASE ) 50 MCG/ACT nasal spray, Place 2 sprays into both nostrils in the morning and at bedtime., Disp: 16 g, Rfl: 3   omeprazole  (PRILOSEC) 40 MG capsule, TAKE 1 CAPSULE TWICE DAILY, Disp: 180 capsule, Rfl: 0   rosuvastatin  (CRESTOR ) 20 MG tablet, Take 1 tablet (20 mg total) by mouth at bedtime., Disp: 90 tablet, Rfl: 3   solifenacin  (VESICARE ) 5 MG tablet, Take 1 tablet (5 mg total) by mouth daily., Disp: 90 tablet, Rfl: 3   tamsulosin  (FLOMAX ) 0.4 MG CAPS capsule, Take 1 capsule (0.4 mg total) by mouth daily., Disp: 90 capsule, Rfl: 3 [2]  Allergies Allergen Reactions   Levitra [Vardenafil Hydrochloride]     headache   Lincomycin Hives   Penicillins Hives    REACTION: 40 years ago   Tadalafil     REACTION: headache   Triazolam     REACTION: hallucinations   Vardenafil Nausea And Vomiting    headache   "

## 2024-10-22 ENCOUNTER — Encounter: Admitting: Family Medicine

## 2024-11-30 ENCOUNTER — Ambulatory Visit
